# Patient Record
Sex: Male | Born: 1985 | Race: White | Hispanic: Yes | Marital: Married | Smoking: Never smoker
Health system: Southern US, Community
[De-identification: ages and names within clinical notes are randomized; demographics above are authoritative.]

## PROBLEM LIST (undated history)

## (undated) DIAGNOSIS — R51 Headache: Secondary | ICD-10-CM

## (undated) DIAGNOSIS — Z789 Other specified health status: Secondary | ICD-10-CM

## (undated) HISTORY — PX: HAND SURGERY: SHX662

## (undated) SURGERY — Surgical Case
Anesthesia: *Unknown

---

## 2012-07-23 ENCOUNTER — Other Ambulatory Visit: Payer: Self-pay | Admitting: Orthopedic Surgery

## 2012-07-24 ENCOUNTER — Inpatient Hospital Stay (HOSPITAL_COMMUNITY)
Admission: AD | Admit: 2012-07-24 | Discharge: 2012-07-31 | DRG: 513 | Disposition: A | Discharge status: Home Health | Payer: Worker's Compensation | Source: Ambulatory Visit | Attending: Orthopedic Surgery | Admitting: Orthopedic Surgery

## 2012-07-24 ENCOUNTER — Other Ambulatory Visit: Payer: Self-pay | Admitting: Orthopedic Surgery

## 2012-07-24 ENCOUNTER — Encounter (HOSPITAL_COMMUNITY): Payer: Self-pay | Admitting: *Deleted

## 2012-07-24 ENCOUNTER — Inpatient Hospital Stay (HOSPITAL_COMMUNITY): Payer: Worker's Compensation

## 2012-07-24 DIAGNOSIS — L02519 Cutaneous abscess of unspecified hand: Secondary | ICD-10-CM | POA: Diagnosis present

## 2012-07-24 DIAGNOSIS — M869 Osteomyelitis, unspecified: Principal | ICD-10-CM | POA: Diagnosis present

## 2012-07-24 DIAGNOSIS — Z6837 Body mass index (BMI) 37.0-37.9, adult: Secondary | ICD-10-CM

## 2012-07-24 HISTORY — DX: Other specified health status: Z78.9

## 2012-07-24 LAB — CBC WITH DIFFERENTIAL/PLATELET
Basophils Absolute: 0.1 10*3/uL (ref 0.0–0.1)
Eosinophils Absolute: 0.2 10*3/uL (ref 0.0–0.7)
Eosinophils Relative: 3 % (ref 0–5)
Lymphocytes Relative: 45 % (ref 12–46)
MCV: 82.1 fL (ref 78.0–100.0)
Platelets: 224 10*3/uL (ref 150–400)
RDW: 13.5 % (ref 11.5–15.5)
WBC: 6.7 10*3/uL (ref 4.0–10.5)

## 2012-07-24 LAB — SEDIMENTATION RATE: Sed Rate: 1 mm/hr (ref 0–16)

## 2012-07-24 LAB — PROTIME-INR: Prothrombin Time: 14.7 seconds (ref 11.6–15.2)

## 2012-07-24 LAB — BASIC METABOLIC PANEL
CO2: 26 mEq/L (ref 19–32)
Calcium: 9.3 mg/dL (ref 8.4–10.5)
Creatinine, Ser: 0.7 mg/dL (ref 0.50–1.35)
GFR calc non Af Amer: >90 mL/min (ref >90)

## 2012-07-24 MED ORDER — ONDANSETRON HCL 4 MG/2ML IJ SOLN: 4.0000 mg | Freq: Four times a day (QID) | INTRAMUSCULAR | Status: DC | PRN

## 2012-07-24 MED ORDER — VANCOMYCIN HCL IN DEXTROSE 1-5 GM/200ML-% IV SOLN: 1000.0000 mg | Freq: Two times a day (BID) | INTRAVENOUS | Status: DC

## 2012-07-24 MED ORDER — LACTATED RINGERS IV SOLN
INTRAVENOUS | Status: DC
  Administered 2012-07-27: 03:00:00 via INTRAVENOUS

## 2012-07-24 MED ORDER — MAGNESIUM HYDROXIDE 400 MG/5ML PO SUSP: 30.0000 mL | Freq: Every day | ORAL | Status: DC | PRN

## 2012-07-24 MED ORDER — HYDROCODONE-ACETAMINOPHEN 5-325 MG PO TABS
1.0000 | ORAL_TABLET | ORAL | Status: DC | PRN
  Administered 2012-07-26: 2 via ORAL
  Administered 2012-07-27 (×2): 1 via ORAL
  Administered 2012-07-27: 2 via ORAL
  Filled 2012-07-24 (×2): qty 1
  Filled 2012-07-24: qty 2
  Filled 2012-07-24: qty 1
  Filled 2012-07-24: qty 2

## 2012-07-24 MED ORDER — SODIUM CHLORIDE 0.9 % IV SOLN
1250.0000 mg | Freq: Two times a day (BID) | INTRAVENOUS | Status: DC
  Administered 2012-07-25 – 2012-07-27 (×5): 1250 mg via INTRAVENOUS
  Filled 2012-07-24 (×5): qty 1250

## 2012-07-24 MED ORDER — DIPHENHYDRAMINE HCL 25 MG PO CAPS
25.0000 mg | ORAL_CAPSULE | Freq: Four times a day (QID) | ORAL | Status: DC | PRN
  Administered 2012-07-24: 25 mg via ORAL
  Filled 2012-07-24: qty 1

## 2012-07-24 MED ORDER — ONDANSETRON HCL 4 MG PO TABS: 4.0000 mg | ORAL_TABLET | Freq: Four times a day (QID) | ORAL | Status: DC | PRN

## 2012-07-24 MED ORDER — OXYCODONE-ACETAMINOPHEN 5-325 MG PO TABS: 1.0000 | ORAL_TABLET | ORAL | Status: DC | PRN

## 2012-07-24 MED ORDER — PIPERACILLIN-TAZOBACTAM 3.375 G IVPB
3.3750 g | Freq: Three times a day (TID) | INTRAVENOUS | Status: DC
  Administered 2012-07-24: 3.375 g via INTRAVENOUS
  Filled 2012-07-24 (×3): qty 50

## 2012-07-24 MED ORDER — VANCOMYCIN HCL 10 G IV SOLR
2000.0000 mg | Freq: Once | INTRAVENOUS | Status: AC
  Administered 2012-07-24: 2000 mg via INTRAVENOUS
  Filled 2012-07-24: qty 2000

## 2012-07-24 MED ORDER — PIPERACILLIN-TAZOBACTAM 3.375 G IVPB
3.3750 g | Freq: Three times a day (TID) | INTRAVENOUS | Status: DC
  Administered 2012-07-25 – 2012-07-29 (×15): 3.375 g via INTRAVENOUS
  Filled 2012-07-24 (×16): qty 50

## 2012-07-24 MED ORDER — GADOBENATE DIMEGLUMINE 529 MG/ML IV SOLN
20.0000 mL | Freq: Once | INTRAVENOUS | Status: AC | PRN
  Administered 2012-07-24: 20 mL via INTRAVENOUS

## 2012-07-24 MED ORDER — ADULT MULTIVITAMIN W/MINERALS CH
1.0000 | ORAL_TABLET | Freq: Every day | ORAL | Status: DC
  Administered 2012-07-25 – 2012-07-31 (×6): 1 via ORAL
  Filled 2012-07-24 (×7): qty 1

## 2012-07-24 MED ORDER — DOCUSATE SODIUM 100 MG PO CAPS
100.0000 mg | ORAL_CAPSULE | Freq: Two times a day (BID) | ORAL | Status: DC
  Administered 2012-07-24 – 2012-07-31 (×11): 100 mg via ORAL
  Filled 2012-07-24 (×5): qty 1

## 2012-07-24 MED ORDER — HYDROMORPHONE HCL PF 1 MG/ML IJ SOLN
0.5000 mg | INTRAMUSCULAR | Status: DC | PRN
  Administered 2012-07-26: 1 mg via INTRAVENOUS
  Filled 2012-07-24: qty 1

## 2012-07-24 NOTE — Progress Notes (Signed)
ANTIBIOTIC CONSULT NOTE - INITIAL  Pharmacy Consult for Vancomycin & Zosyn Indication: L hand infection, likely osteomyelitis  Allergies not on file  Patient Measurements: Weight: 278 lb (126.1 kg)  Vital Signs: Temp: 98.2 F (36.8 C) (02/19 1523) Temp src: Oral (02/19 1523) BP: 144/85 mmHg (02/19 1523) Pulse Rate: 83 (02/19 1523) Intake/Output from previous day:   Intake/Output from this shift:    Labs: No results found for this basename: WBC, HGB, PLT, LABCREA, CREATININE,  in the last 72 hours CrCl is unknown because no creatinine reading has been taken and the patient has no height on file. No results found for this basename: VANCOTROUGH, VANCOPEAK, VANCORANDOM, GENTTROUGH, GENTPEAK, GENTRANDOM, TOBRATROUGH, TOBRAPEAK, TOBRARND, AMIKACINPEAK, AMIKACINTROU, AMIKACIN,  in the last 72 hours   Microbiology: No results found for this or any previous visit (from the past 720 hour(s)).  Medical History: No past medical history on file.  Medications:  Anti-infectives   Start     Dose/Rate Route Frequency Ordered Stop   07/25/12 0400  vancomycin (VANCOCIN) 1,250 mg in sodium chloride 0.9 % 250 mL IVPB     1,250 mg 166.7 mL/hr over 90 Minutes Intravenous Every 12 hours 07/24/12 1606     07/24/12 1630  vancomycin (VANCOCIN) 2,000 mg in sodium chloride 0.9 % 500 mL IVPB     2,000 mg 250 mL/hr over 120 Minutes Intravenous  Once 07/24/12 1606     07/24/12 1600  piperacillin-tazobactam (ZOSYN) IVPB 3.375 g     3.375 g 12.5 mL/hr over 240 Minutes Intravenous 3 times per day 07/24/12 1554     07/24/12 1554  vancomycin (VANCOCIN) IVPB 1000 mg/200 mL premix  Status:  Discontinued     1,000 mg 200 mL/hr over 60 Minutes Intravenous Every 12 hours 07/24/12 1554 07/24/12 1602     Assessment: 26 yoM with crush injury to left hand 05/09/12, surgical repair included pin fixation, with hardware removal 06/18/12. Now with fever,chills; begin IV antibiotics-Vancomycin and Zosyn; pharmacy to  dose.  Possible osteo noted  Goal of Therapy:  Vancomycin trough level 15-20 mcg/ml Aiming for higher trough level with possibility of osteomyelitis and planned antibiotic spacer  Plan:  Vancomycin 2000mg  x1, then 1250mg  q12 Zosyn 3.375gm q8h- 4 hr infusion. Vanc trough at steady state if necessary.  Otho Bellows PharmD Pager [phone] 07/24/2012,4:04 PM

## 2012-07-24 NOTE — H&P (Signed)
Primary Care Provider: None Referring Provider: None Worker's Comp: Yes Date of Injury or Onset: 05-09-12  History: CC / Reason for Visit: Left hand problem HPI: This patient is a 27 year old Latino male who presents for evaluation of a left hand problem that began on the date above.  Reportedly the left hand was crushed in a large industrial-type drill.  This occurred while he was working in [place].  He was initially cared for by Dr. Norman Herrlich, of [facility] and underwent open reduction, percutaneous pin fixation of left fourth and fifth metacarpal fractures on 05-14-12.  X-rays from 05-24-12 have become available for my review within the last couple of days indicating transverse fractures through the mid diaphysis of the fifth metacarpal and the proximal diaphysis of the fourth metacarpal with near-anatomic alignment and single pin fixation of each in place.  Subsequent x-rays from 06-12-12 are also available, indicating some early osseous healing of the fifth metacarpal and also some of the fourth.  Records indicate in the patient confirms that on 06-18-12, he returned to the operating room for removal of hardware under MAC anesthesia as both hands have been slightly buried with tissue about them.  The distal pin is noted to be somewhat buried beneath the skin in the operative report.  It is my understanding that the patient has not had any subsequent followup.  He presented initially for an incomplete visit to this office on 07-12-12, but without any of his previous records or x-rays available.  Since those records have become available, he has been rescheduled to his first official new patient appointment tomorrow, but presents today instead noting the onset of some chills and feeling warm since last night.  He continues to have difficulty regaining range of motion in the left hand and it remains swollen, particularly dorsally.  Past medical history, past surgical history, family  history, social history, medications, allergies and review of systems are thoroughly reviewed by me, signed and scanned into SRS today.  The patient is otherwise healthy.  Exam:  Vitals: Refer to EMR. Constitutional:  WD, WN, NAD HEENT:  NCAT, EOMI Neuro/Psych:  Alert & oriented to person, place, and time; appropriate mood & affect Lymphatic: No generalized UE edema or lymphadenopathy Extremities / MSK:  Both UE are normal with respect to appearance, ranges of motion, joint stability, muscle strength/tone, sensation, & perfusion except as otherwise noted:  Left hand has no active drainage.  A well-healed incision is present over the interspace between the fourth and fifth metacarpals.  Digital motion is less than full with the fingertips 3 cm from the palm.  There is tenderness and firm swelling without fluctuance on the dorsum of the hand particularly over the third fourth and fifth metacarpal region.  No specific redness noted.  Labs / Xrays:  No radiographic studies obtained today.  X-rays from his previous care up compared to those from 07-13-11.  There has been progressive changes of Lytic and sclerotic regions consistent with cortical erosion in the midportion of the fourth metacarpal.   Fifth metacarpal appears more healed.    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  Plan:  I discussed these findings in detail with the patient and outlined a plan for treatment and reconstruction.  He will be admitted today to [facility] and initiation of IV antibiotic therapy and we will plan to proceed with surgical debridement of all infected tissues on Friday.  He will likely  need placement of antibiotic PMMA spacer to allow for possible subsequent fourth metacarpal reconstruction once the infection has been eradicated.  I will obtain an MRI scan of the left hand and subsequently consult infectious diseases regarding antibiotic  management, but will begin with vancomycin and Zosyn.  Work status: All interested parties should please consider this patient to be out of work entirely if no work is available that complies with the restrictions detailed below.  If these restrictions result in the patient being out of work entirely, the employer is expected to provide documentation of such to all interested third parties such as disability insurance companies: No work with left hand

## 2012-07-25 MED ORDER — SODIUM CHLORIDE 0.9 % IJ SOLN
10.0000 mL | INTRAMUSCULAR | Status: DC | PRN
  Administered 2012-07-27 – 2012-07-29 (×2): 10 mL

## 2012-07-25 NOTE — Progress Notes (Signed)
UR completed 

## 2012-07-25 NOTE — Progress Notes (Signed)
Subjective: HD 2, Left hand osteomyelitis Pain adequately controlled, consent executed   Objective: Vital signs in last 24 hours: Temp:  [97.6 F (36.4 C)-97.8 F (36.6 C)] 97.6 F (36.4 C) (02/20 1318) Pulse Rate:  [59-65] 64 (02/20 1318) Resp:  [18-20] 18 (02/20 1318) BP: (109-144)/(68-84) 142/76 mmHg (02/20 1318) SpO2:  [94 %-98 %] 94 % (02/20 1318)  Intake/Output from previous day: 02/19 0701 - 02/20 0700 In: 600 [IV Piggyback:600] Out: -  Intake/Output this shift:     Recent Labs  07/24/12 1638  HGB 15.1    Recent Labs  07/24/12 1638  WBC 6.7  RBC 5.43  HCT 44.6  PLT 224    Recent Labs  07/24/12 1638  NA 138  K 4.0  CL 100  CO2 26  BUN 12  CREATININE 0.70  GLUCOSE 89  CALCIUM 9.3    Recent Labs  07/24/12 1638  INR 1.17    Left hand unchanged--swollen with poor active flexion of RF/SF   Assessment/Plan: OR tomorrow, approximately 12:30, for debridement.  Will obtain cultures. Continue current antibiotic treatment for now.   Bienvenido Proehl A. 07/25/2012, 5:38 PM

## 2012-07-25 NOTE — Progress Notes (Signed)
Nutrition Brief Note  Patient identified on the Malnutrition Screening Tool (MST) Report  Body mass index is 37.7 kg/(m^2). Patient meets criteria for class II obesity based on current BMI.   Current diet order is regular, patient is consuming approximately 100% of meals at this time. Labs and medications reviewed. Pt reports great appetite PTA and eating well, 3 meals/day. Pt reports 15 pound unintended weight loss in the past month which pt attributes to stress from accident on his left hand. Pt reports he was wondering what would happen with his job and how he would support his family in [place] Recommend social work consult to help with these issues.   No nutrition interventions warranted at this time. If nutrition issues arise, please consult RD.   Levon Hedger MS, RD, LDN [phone] Pager [phone] After Hours Pager

## 2012-07-25 NOTE — Progress Notes (Signed)
Peripherally Inserted Central Catheter/Midline Placement  The IV Nurse has discussed with the patient and/or persons authorized to consent for the patient, the purpose of this procedure and the potential benefits and risks involved with this procedure.  The benefits include less needle sticks, lab draws from the catheter and patient may be discharged home with the catheter.  Risks include, but not limited to, infection, bleeding, blood clot (thrombus formation), and puncture of an artery; nerve damage and irregular heat beat.  Alternatives to this procedure were also discussed.  PICC/Midline Placement Documentation        Keith Nash 07/25/2012, 11:14 AM

## 2012-07-26 ENCOUNTER — Inpatient Hospital Stay (HOSPITAL_COMMUNITY): Payer: Worker's Compensation

## 2012-07-26 ENCOUNTER — Encounter (HOSPITAL_COMMUNITY): Payer: Self-pay | Admitting: Certified Registered Nurse Anesthetist

## 2012-07-26 ENCOUNTER — Encounter (HOSPITAL_COMMUNITY): Payer: Self-pay | Admitting: Anesthesiology

## 2012-07-26 ENCOUNTER — Inpatient Hospital Stay (HOSPITAL_COMMUNITY): Payer: Worker's Compensation | Admitting: Anesthesiology

## 2012-07-26 ENCOUNTER — Encounter (HOSPITAL_COMMUNITY)
Admission: AD | Disposition: A | Discharge status: Home Health | Payer: Self-pay | Source: Ambulatory Visit | Attending: Orthopedic Surgery

## 2012-07-26 ENCOUNTER — Inpatient Hospital Stay: Admit: 2012-07-26 | Payer: Self-pay | Admitting: Orthopedic Surgery

## 2012-07-26 HISTORY — PX: AMPUTATION: SHX166

## 2012-07-26 SURGERY — OPEN REDUCTION INTERNAL FIXATION (ORIF) METACARPAL
Anesthesia: General | Laterality: Left

## 2012-07-26 SURGERY — AMPUTATION DIGIT
Anesthesia: General | Site: Hand | Laterality: Left | Wound class: Dirty or Infected

## 2012-07-26 MED ORDER — LIDOCAINE HCL (CARDIAC) 20 MG/ML IV SOLN
INTRAVENOUS | Status: DC | PRN
  Administered 2012-07-26: 100 mg via INTRAVENOUS

## 2012-07-26 MED ORDER — PROPOFOL 10 MG/ML IV BOLUS
INTRAVENOUS | Status: DC | PRN
  Administered 2012-07-26: 200 mg via INTRAVENOUS

## 2012-07-26 MED ORDER — MIDAZOLAM HCL 5 MG/5ML IJ SOLN
INTRAMUSCULAR | Status: DC | PRN
  Administered 2012-07-26: 2 mg via INTRAVENOUS

## 2012-07-26 MED ORDER — BUPIVACAINE-EPINEPHRINE 0.5% -1:200000 IJ SOLN
INTRAMUSCULAR | Status: DC | PRN
  Administered 2012-07-26: 10 mL

## 2012-07-26 MED ORDER — 0.9 % SODIUM CHLORIDE (POUR BTL) OPTIME
TOPICAL | Status: DC | PRN
  Administered 2012-07-26: 1000 mL

## 2012-07-26 MED ORDER — KETOROLAC TROMETHAMINE 30 MG/ML IJ SOLN: 15.0000 mg | Freq: Once | INTRAMUSCULAR | Status: DC | PRN

## 2012-07-26 MED ORDER — NAPROXEN 500 MG PO TABS
500.0000 mg | ORAL_TABLET | Freq: Two times a day (BID) | ORAL | Status: DC
  Administered 2012-07-26 – 2012-07-31 (×10): 500 mg via ORAL
  Filled 2012-07-26 (×12): qty 1

## 2012-07-26 MED ORDER — LACTATED RINGERS IV SOLN
INTRAVENOUS | Status: DC | PRN
  Administered 2012-07-26 (×2): via INTRAVENOUS

## 2012-07-26 MED ORDER — METOCLOPRAMIDE HCL 5 MG/ML IJ SOLN
INTRAMUSCULAR | Status: DC | PRN
  Administered 2012-07-26: 5 mg via INTRAVENOUS

## 2012-07-26 MED ORDER — FENTANYL CITRATE 0.05 MG/ML IJ SOLN
INTRAMUSCULAR | Status: DC | PRN
  Administered 2012-07-26: 50 ug via INTRAVENOUS
  Administered 2012-07-26: 100 ug via INTRAVENOUS
  Administered 2012-07-26 (×3): 50 ug via INTRAVENOUS
  Administered 2012-07-26: 100 ug via INTRAVENOUS
  Administered 2012-07-26 (×2): 50 ug via INTRAVENOUS

## 2012-07-26 MED ORDER — ONDANSETRON HCL 4 MG/2ML IJ SOLN
INTRAMUSCULAR | Status: DC | PRN
  Administered 2012-07-26 (×2): 2 mg via INTRAVENOUS

## 2012-07-26 MED ORDER — ACETAMINOPHEN 10 MG/ML IV SOLN
INTRAVENOUS | Status: DC | PRN
  Administered 2012-07-26: 1000 mg via INTRAVENOUS

## 2012-07-26 MED ORDER — FENTANYL CITRATE 0.05 MG/ML IJ SOLN: 25.0000 ug | INTRAMUSCULAR | Status: DC | PRN

## 2012-07-26 MED ORDER — PROMETHAZINE HCL 25 MG/ML IJ SOLN: 6.2500 mg | INTRAMUSCULAR | Status: DC | PRN

## 2012-07-26 SURGICAL SUPPLY — 53 items
0.62 KWIRE STRYKER ×2 IMPLANT
BANDAGE COBAN STERILE 2 (GAUZE/BANDAGES/DRESSINGS) IMPLANT
BANDAGE CONFORM 2  STR LF (GAUZE/BANDAGES/DRESSINGS) IMPLANT
BANDAGE CONFORM 3  STR LF (GAUZE/BANDAGES/DRESSINGS) IMPLANT
BANDAGE GAUZE ELAST BULKY 4 IN (GAUZE/BANDAGES/DRESSINGS) IMPLANT
BLADE AVERAGE 25X9 (BLADE) IMPLANT
BLADE MINI RND TIP GREEN BEAV (BLADE) IMPLANT
BLADE OSCILLATING/SAGITTAL (BLADE) ×1
BLADE SURG 15 STRL LF DISP TIS (BLADE) ×1 IMPLANT
BLADE SURG 15 STRL SS (BLADE) ×1
BLADE SW THK.38XMED LNG THN (BLADE) ×1 IMPLANT
BNDG COHESIVE 4X5 TAN NS LF (GAUZE/BANDAGES/DRESSINGS) IMPLANT
BNDG COHESIVE 4X5 TAN STRL (GAUZE/BANDAGES/DRESSINGS) ×2 IMPLANT
BNDG ESMARK 4X9 LF (GAUZE/BANDAGES/DRESSINGS) IMPLANT
BONE CEMENT GENTAMICIN (Cement) ×2 IMPLANT
CEMENT BONE GENTAMICIN 40 (Cement) ×1 IMPLANT
CHLORAPREP W/TINT 26ML (MISCELLANEOUS) ×2 IMPLANT
CORDS BIPOLAR (ELECTRODE) ×2 IMPLANT
DRAPE C-ARM 42X72 X-RAY (DRAPES) IMPLANT
DRAPE LG THREE QUARTER DISP (DRAPES) IMPLANT
DRAPE SURG 17X11 SM STRL (DRAPES) ×2 IMPLANT
DRSG EMULSION OIL 3X3 NADH (GAUZE/BANDAGES/DRESSINGS) ×2 IMPLANT
ELECT REM PT RETURN 9FT ADLT (ELECTROSURGICAL) ×2
ELECTRODE REM PT RTRN 9FT ADLT (ELECTROSURGICAL) ×1 IMPLANT
GAUZE XEROFORM 1X8 LF (GAUZE/BANDAGES/DRESSINGS) IMPLANT
GAUZE XEROFORM 5X9 LF (GAUZE/BANDAGES/DRESSINGS) IMPLANT
GLOVE BIO SURGEON STRL SZ7.5 (GLOVE) ×2 IMPLANT
GLOVE BIOGEL PI IND STRL 8 (GLOVE) ×1 IMPLANT
GLOVE BIOGEL PI INDICATOR 8 (GLOVE) ×1
GOWN PREVENTION PLUS XLARGE (GOWN DISPOSABLE) ×2 IMPLANT
GUIDEWIRE ORTH 6X062XTROC NS (WIRE) ×1 IMPLANT
K-WIRE .062 (WIRE) ×1
KWIRE 4.0 X .045IN (WIRE) IMPLANT
NEEDLE HYPO 25X1 1.5 SAFETY (NEEDLE) IMPLANT
NS IRRIG 500ML POUR BTL (IV SOLUTION) ×2 IMPLANT
PACK LOWER EXTREMITY WL (CUSTOM PROCEDURE TRAY) ×2 IMPLANT
PAD CAST 4YDX4 CTTN HI CHSV (CAST SUPPLIES) ×1 IMPLANT
PADDING CAST ABS 4INX4YD NS (CAST SUPPLIES) ×1
PADDING CAST ABS COTTON 4X4 ST (CAST SUPPLIES) ×1 IMPLANT
PADDING CAST COTTON 4X4 STRL (CAST SUPPLIES) ×1
PENCIL BUTTON HOLSTER BLD 10FT (ELECTRODE) IMPLANT
SPONGE GAUZE 4X4 12PLY (GAUZE/BANDAGES/DRESSINGS) ×2 IMPLANT
STOCKINETTE 4X48 STRL (DRAPES) IMPLANT
SUCTION FRAZIER TIP 10 FR DISP (SUCTIONS) IMPLANT
SUT ETHILON 4 0 PS 2 18 (SUTURE) ×2 IMPLANT
SUT MNCRL AB 4-0 PS2 18 (SUTURE) IMPLANT
SUT VIC AB 4-0 P-3 18XBRD (SUTURE) IMPLANT
SUT VIC AB 4-0 P3 18 (SUTURE)
SUT VICRYL RAPIDE 4/0 PS 2 (SUTURE) IMPLANT
SYR BULB 3OZ (MISCELLANEOUS) ×2 IMPLANT
SYRINGE 10CC LL (SYRINGE) IMPLANT
TOWEL OR 17X24 6PK STRL BLUE (TOWEL DISPOSABLE) ×2 IMPLANT
UNDERPAD 30X30 INCONTINENT (UNDERPADS AND DIAPERS) ×2 IMPLANT

## 2012-07-26 NOTE — Progress Notes (Signed)
Patient seen. Surgery today as outlined previously.

## 2012-07-26 NOTE — Anesthesia Preprocedure Evaluation (Addendum)
Anesthesia Evaluation  Patient identified by MRN, date of birth, ID band Patient awake    Reviewed: Allergy & Precautions, H&P , NPO status , Patient's Chart, lab work & pertinent test results  Airway Mallampati: II  TM Distance: <3 FB Neck ROM: Full    Dental no notable dental hx.    Pulmonary neg pulmonary ROS,  breath sounds clear to auscultation  Pulmonary exam normal       Cardiovascular negative cardio ROS  Rhythm:Regular Rate:Normal     Neuro/Psych negative neurological ROS  negative psych ROS   GI/Hepatic negative GI ROS, Neg liver ROS,   Endo/Other  Morbid obesity  Renal/GU negative Renal ROS  negative genitourinary   Musculoskeletal negative musculoskeletal ROS (+)   Abdominal   Peds negative pediatric ROS (+)  Hematology negative hematology ROS (+)   Anesthesia Other Findings   Reproductive/Obstetrics negative OB ROS                             Anesthesia Physical Anesthesia Plan  ASA: II  Anesthesia Plan: General   Post-op Pain Management:    Induction: Intravenous  Airway Management Planned: LMA  Additional Equipment:   Intra-op Plan:   Post-operative Plan:   Informed Consent: I have reviewed the patients History and Physical, chart, labs and discussed the procedure including the risks, benefits and alternatives for the proposed anesthesia with the patient or authorized representative who has indicated his/her understanding and acceptance.   Dental advisory given  Plan Discussed with: CRNA and Surgeon  Anesthesia Plan Comments:         Anesthesia Quick Evaluation  

## 2012-07-26 NOTE — Progress Notes (Signed)
ANTIBIOTIC CONSULT NOTE - FOLLOW UP  Pharmacy Consult for Vancomycin, Zosyn Indication: hand osteomyelitis  No Known Allergies  Patient Measurements: Height: 6' (182.9 cm) (per pt report) Weight: 278 lb (126.1 kg) IBW/kg (Calculated) : 77.6  Vital Signs: Temp: 97.7 F (36.5 C) (02/21 0654) Temp src: Oral (02/21 0654) BP: 142/88 mmHg (02/21 0654) Pulse Rate: 62 (02/21 0654) Intake/Output from previous day: 02/20 0701 - 02/21 0700 In: 2900 [P.O.:1200; IV Piggyback:1700] Out: -   Labs:  Recent Labs  07/24/12 1638  WBC 6.7  HGB 15.1  PLT 224  CREATININE 0.70   Estimated Creatinine Clearance: 192 ml/min (by C-G formula based on Cr of 0.7). No results found for this basename: VANCOTROUGH, VANCOPEAK, VANCORANDOM, GENTTROUGH, GENTPEAK, GENTRANDOM, TOBRATROUGH, TOBRAPEAK, TOBRARND, AMIKACINPEAK, AMIKACINTROU, AMIKACIN,  in the last 72 hours   Microbiology: No results found for this or any previous visit (from the past 720 hour(s)).  Anti-infectives: 2/19 >> Vanc >> 2/19 >> Zosyn >>  Assessment:  21 yoM with crush injury to left hand 05/09/12, surgical repair included pin fixation, with hardware removal 06/18/12.   Presents to [facility] ED 2/19 with fever, chills, and concern for osteomyelitis.  Pharrmacy asked to assist with antibiotic dosing.  D#3 Vancomycin and Zosyn.  MRI on 2/19 shows osteomyelitis - planning for OR surgical debridement, abx spacer.  No cx obtained yet (will likely get cxt in OR).   No new labs since 2/19 - will get SCr and Vanc trough tonight.  Goal of Therapy:  Vancomycin trough level 15-20 mcg/ml Appropriate abx dosing, eradication of infection.  Plan:   Continue Zosyn 3.375g IV Q8H infused over 4hrs.  Continue Vancomycin 1250mg  IV q12h.  Measure Vanc trough at steady state.  Follow up renal fxn and culture results.   Lynann Beaver PharmD, BCPS Pager [phone] 07/26/2012 11:41 AM

## 2012-07-26 NOTE — Preoperative (Addendum)
Beta Blockers   Reason not to administer Beta Blockers:Not Applicable 

## 2012-07-26 NOTE — Op Note (Signed)
07/24/2012 - 07/26/2012  2:40 PM  PATIENT:  Keith Nash  27 y.o. male  PRE-OPERATIVE DIAGNOSIS:  Suspected Left hand osteomyelitis & surrounding phlegmon  POST-OPERATIVE DIAGNOSIS:  Same  PROCEDURE:  Left hand debridement of phlegmon including 3.75 cm of 4th MC and surrounding soft-tissue with implantation of gentamicin cement spacer and central k-wire                            Manipulation of SF MPJ & PIPJ; Manipulation of RF MPJ  SURGEON: Cliffton Asters. Janee Morn, MD  PHYSICIAN ASSISTANT: None  ANESTHESIA:  general  SPECIMENS:  None  DRAINS:   None  PREOPERATIVE INDICATIONS:  Keith Nash is a  27 y.o. male with a diagnosis of suspected left hand osteomyelitis with surrounding phlegmon  The risks benefits and alternatives were discussed with the patient preoperatively including but not limited to the risks of infection, bleeding, nerve injury, cardiopulmonary complications, the need for revision surgery, among others, and the patient verbalized understanding and consented to proceed.  OPERATIVE IMPLANTS: 0.062 kwire within the gent cement spacer  OPERATIVE FINDINGS:  Soft 4 MC shaft suspicious for osteomyelitis, with surrounding phlegmon  OPERATIVE PROCEDURE:  The patient was escorted to the operative theatre and placed in a supine position.  GA was administered.  A surgical "time-out" was performed during which the planned procedure, proposed operative site, and the correct patient identity were compared to the operative consent and agreement confirmed by the circulating nurse according to current facility policy.  Following application of a tourniquet to the operative extremity, the exposed skin was prepped with Chloraprep and draped in the usual sterile fashion.  The limb was exsanguinated with an Esmarch bandage and the tourniquet inflated to approximately higher than systolic BP.  The previous incision was marked then elliptically excised. It was  extended proximally and distally a slightly curvilinear fashion creating a radial-based flap. The skin and subcutaneous tissues were dissected with sharp and spreading dissection. Extensor tendons were identified and retracted radially. The ring and small finger MP joints were gently but firmly manipulated into full flexion and held in a position for 30 seconds. The PIP joint of the small finger was manipulated into full extension and tissue using disruption was appreciated. The tissue over the metacarpal was thickened and firm, there was no fluid component. Longitudinal dissection was carried sharply down overload midshaft of the metacarpal. Metacarpals found to be quite soft. He was easily excised piecemeal with a rongeur. Ultimately a saw was used to sharply divide the fourth metacarpal transversely in the region where grossly it looked to be acceptable. 3.75 cm of the metacarpal shaft was excised in this manner, some were sent to pathology. The surrounding phlegmon was debrided with the rongeur and sharp dissection.  Tissue was sent for culture.  Once satisfied with the degree debridement, a gentamicin impregnated cement spacer was constructed with a central longitudinal K wire. The medullary cavity of the distal fragment was debrided with an osteotome and then the K wire which was left protruding from spacer was placed within the central canal thus helping to secure it to the distal bone fragment. It had to be adjusted in length twice to achieve an appropriate length. This was done by cutting spacer with a soft. The proximal and the K wire was within the spacer but did not penetrate the bone. The area resection was copiously irrigated and tourniquet released. Some additional hemostasis obtained in  this patient was placed. 1/4 inch penrose drain was left emanating from the proximal & distal aspects of the incision and the skin was closed with 4-0 nylon interrupted sutures.  A bulky hand dressing was applied  with dorsal splint component placing the wrist in slight extension, the MP joints flexed towards 90 and IP joints extended. The file tourniquet release was performed the patient was awakened and taken to recovery in stable condition.  DISPOSITION: Patient transferred back to the floor for further care. Antibiotics will be managed according to the culture results, likely with infectious diseases consultation.

## 2012-07-26 NOTE — Transfer of Care (Signed)
Immediate Anesthesia Transfer of Care Note  Patient: Jazmin Fuentes-Valentin  Procedure(s) Performed: Procedure(s) with comments: PARTIAL EXCISION 4TH METACARPAL/RADICAL EXCISION OF EXTENSOR BURSA/TENOSYNOVIUM (Left) - PLACEMENT OF ANTIBIOTIC CEMENT SPACER   Patient Location: PACU  Anesthesia Type:General  Level of Consciousness: awake, oriented, patient cooperative, lethargic and responds to stimulation  Airway & Oxygen Therapy: Patient Spontanous Breathing and Patient connected to face mask oxygen  Post-op Assessment: Report given to PACU RN, Post -op Vital signs reviewed and stable and Patient moving all extremities  Post vital signs: Reviewed and stable  Complications: No apparent anesthesia complications

## 2012-07-27 LAB — CREATININE, SERUM: Creatinine, Ser: 0.78 mg/dL (ref 0.50–1.35)

## 2012-07-27 MED ORDER — VANCOMYCIN HCL 10 G IV SOLR
1250.0000 mg | Freq: Three times a day (TID) | INTRAVENOUS | Status: DC
  Administered 2012-07-27 – 2012-07-28 (×4): 1250 mg via INTRAVENOUS
  Filled 2012-07-27 (×5): qty 1250

## 2012-07-27 NOTE — Progress Notes (Signed)
ANTIBIOTIC CONSULT NOTE - FOLLOW UP  Pharmacy Consult for vancomycin Indication: L hand infection, likely osteomyelitis   No Known Allergies  Patient Measurements: Height: 6' (182.9 cm) Weight: 278 lb (126.1 kg) IBW/kg (Calculated) : 77.6 Adjusted Body Weight:   Vital Signs: Temp: 98.7 F (37.1 C) (02/22 0537) Temp src: Oral (02/22 0537) BP: 143/82 mmHg (02/22 0537) Pulse Rate: 67 (02/22 0537) Intake/Output from previous day: 02/21 0701 - 02/22 0700 In: 2590 [P.O.:580; I.V.:1210; IV Piggyback:800] Out: -  Intake/Output from this shift: Total I/O In: 820 [P.O.:220; I.V.:300; IV Piggyback:300] Out: -   Labs:  Recent Labs  07/24/12 1638 07/27/12 0500  WBC 6.7  --   HGB 15.1  --   PLT 224  --   CREATININE 0.70 0.78   Estimated Creatinine Clearance: 192 ml/min (by C-G formula based on Cr of 0.78).  Recent Labs  07/27/12 0500  VANCOTROUGH 5.1*     Microbiology: No results found for this or any previous visit (from the past 720 hour(s)).  Anti-infectives   Start     Dose/Rate Route Frequency Ordered Stop   07/27/12 1200  vancomycin (VANCOCIN) 1,250 mg in sodium chloride 0.9 % 250 mL IVPB     1,250 mg 166.7 mL/hr over 90 Minutes Intravenous Every 8 hours 07/27/12 0607     07/25/12 0600  vancomycin (VANCOCIN) 1,250 mg in sodium chloride 0.9 % 250 mL IVPB  Status:  Discontinued     1,250 mg 166.7 mL/hr over 90 Minutes Intravenous Every 12 hours 07/24/12 1606 07/27/12 0608   07/25/12 0200  piperacillin-tazobactam (ZOSYN) IVPB 3.375 g     3.375 g 12.5 mL/hr over 240 Minutes Intravenous Every 8 hours 07/24/12 2102     07/24/12 1630  vancomycin (VANCOCIN) 2,000 mg in sodium chloride 0.9 % 500 mL IVPB     2,000 mg 250 mL/hr over 120 Minutes Intravenous  Once 07/24/12 1606 07/24/12 2056   07/24/12 1600  piperacillin-tazobactam (ZOSYN) IVPB 3.375 g  Status:  Discontinued     3.375 g 12.5 mL/hr over 240 Minutes Intravenous 3 times per day 07/24/12 1554 07/24/12 2102    07/24/12 1554  vancomycin (VANCOCIN) IVPB 1000 mg/200 mL premix  Status:  Discontinued     1,000 mg 200 mL/hr over 60 Minutes Intravenous Every 12 hours 07/24/12 1554 07/24/12 1602      Assessment: Patient with low vancomycin level.  Prior doses were charted appropriately.  2gm load was charted, feel due to young age, body mass and good renal function that this expected dose is not enough for this patient.    Goal of Therapy:  Vancomycin trough level 15-20 mcg/ml  Plan:  Measure antibiotic drug levels at steady state Follow up culture results Change to vancomycin 1250mg  iv q8hr, next dose at 1200 (~6hr prior to last dose)  Darlina Guys, Napoleon Crowford 07/27/2012,6:08 AM

## 2012-07-27 NOTE — Progress Notes (Addendum)
Subjective: POD 1 L hand debridement (3.75 cm of excised, antibiotic spacer placed) Pain control OK, wiggling fingers a little   Objective: Vital signs in last 24 hours: Temp:  [97.6 F (36.4 C)-98.7 F (37.1 C)] 98.7 F (37.1 C) (02/22 0537) Pulse Rate:  [57-77] 67 (02/22 0537) Resp:  [13-18] 16 (02/22 0537) BP: (129-189)/(74-106) 143/82 mmHg (02/22 0537) SpO2:  [97 %-100 %] 99 % (02/22 0537) Weight:  [126.1 kg (278 lb)] 126.1 kg (278 lb) (02/21 1530)  Intake/Output from previous day: 02/21 0701 - 02/22 0700 In: 2590 [P.O.:580; I.V.:1210; IV Piggyback:800] Out: -  Intake/Output this shift:     Recent Labs  07/24/12 1638  HGB 15.1    Recent Labs  07/24/12 1638  WBC 6.7  RBC 5.43  HCT 44.6  PLT 224    Recent Labs  07/24/12 1638 07/27/12 0500  NA 138  --   K 4.0  --   CL 100  --   CO2 26  --   BUN 12  --   CREATININE 0.70 0.78  GLUCOSE 89  --   CALCIUM 9.3  --     Recent Labs  07/24/12 1638  INR 1.17   Dressing intact, digits in good alignment with MPs flexed and IPs extended NVI  Assessment/Plan: Cxs & path pending Continue zosyn/vanc via PICC until C&S available--will obtain ID c/s then, likely Monday Will likely d/c home with protracted home IV antibiotics Will d/c dressing likely on Sunday Appreciate pharmacy help with IV antibiotic dosing  Keith Nash A. 07/27/2012, 9:13 AM

## 2012-07-28 LAB — VANCOMYCIN, TROUGH: Vancomycin Tr: 6.8 ug/mL — ABNORMAL LOW (ref 10.0–20.0)

## 2012-07-28 MED ORDER — VANCOMYCIN HCL 500 MG IV SOLR
500.0000 mg | Freq: Once | INTRAVENOUS | Status: AC
  Administered 2012-07-28: 500 mg via INTRAVENOUS
  Filled 2012-07-28: qty 500

## 2012-07-28 MED ORDER — NICOTINE 14 MG/24HR TD PT24
14.0000 mg | MEDICATED_PATCH | Freq: Every day | TRANSDERMAL | Status: DC
  Administered 2012-07-28 – 2012-07-31 (×4): 14 mg via TRANSDERMAL
  Filled 2012-07-28 (×4): qty 1

## 2012-07-28 MED ORDER — OXYCODONE-ACETAMINOPHEN 5-325 MG PO TABS: 1.0000 | ORAL_TABLET | ORAL | Status: DC | PRN

## 2012-07-28 MED ORDER — NAPROXEN 500 MG PO TABS: 500.0000 mg | ORAL_TABLET | Freq: Two times a day (BID) | ORAL | Status: DC

## 2012-07-28 MED ORDER — VANCOMYCIN HCL 10 G IV SOLR
1750.0000 mg | Freq: Three times a day (TID) | INTRAVENOUS | Status: DC
  Administered 2012-07-28 – 2012-07-29 (×4): 1750 mg via INTRAVENOUS
  Filled 2012-07-28 (×5): qty 1750

## 2012-07-28 NOTE — Anesthesia Postprocedure Evaluation (Signed)
Anesthesia Post Note  Patient: Keith Nash  Procedure(s) Performed: Procedure(s) (LRB): PARTIAL EXCISION 4TH METACARPAL/RADICAL EXCISION OF EXTENSOR BURSA/TENOSYNOVIUM (Left)  Anesthesia type: General  Patient location: PACU  Post pain: Pain level controlled  Post assessment: Post-op Vital signs reviewed  Last Vitals:  Filed Vitals:   07/28/12 0530  BP: 133/83  Pulse: 83  Temp: 36.8 C  Resp: 16    Post vital signs: Reviewed  Level of consciousness: sedated  Complications: No apparent anesthesia complications

## 2012-07-28 NOTE — Progress Notes (Signed)
Subjective: POD 2 L hand debridement (3.75 cm of excised, antibiotic spacer placed) Pain control OK, wiggling fingers a little  Tol PO, voiding OK  Objective: Vital signs in last 24 hours: Temp:  [97.6 F (36.4 C)-98.3 F (36.8 C)] 98.3 F (36.8 C) (02/23 0530) Pulse Rate:  [60-85] 83 (02/23 0530) Resp:  [16-17] 16 (02/23 0530) BP: (132-145)/(83-89) 133/83 mmHg (02/23 0530) SpO2:  [97 %-99 %] 97 % (02/23 0530)  Intake/Output from previous day: 02/22 0701 - 02/23 0700 In: 3023.3 [P.O.:1580; I.V.:543.3; IV Piggyback:900] Out: -  Intake/Output this shift:    No results found for this basename: HGB,  in the last 72 hours No results found for this basename: WBC, RBC, HCT, PLT,  in the last 72 hours  Recent Labs  07/27/12 0500  CREATININE 0.78   No results found for this basename: LABPT, INR,  in the last 72 hours Dressing removed, penrose drain pulled. Incision clean/dry with no active drainage. digits in good alignment with MPs flexed and IPs extended NVI  Assessment/Plan: Cxs & path pending Continue zosyn/vanc via PICC until C&S available--will obtain ID c/s then, likely Monday Will likely d/c home with protracted home IV antibiotics Begin light daily dressing changes and OT for ROM work Appreciate pharmacy help with IV antibiotic dosing  Ayush Boulet A. 07/28/2012, 8:48 AM

## 2012-07-28 NOTE — Progress Notes (Signed)
ANTIBIOTIC CONSULT NOTE - FOLLOW UP  Pharmacy Consult for Vancomycin, Zosyn Indication: hand osteomyelitis  No Known Allergies  Patient Measurements: Height: 6' (182.9 cm) Weight: 278 lb (126.1 kg) IBW/kg (Calculated) : 77.6  Vital Signs: Temp: 98.3 F (36.8 C) (02/23 0530) Temp src: Oral (02/23 0530) BP: 133/83 mmHg (02/23 0530) Pulse Rate: 83 (02/23 0530)  Labs:  Recent Labs  07/27/12 0500  CREATININE 0.78   Estimated Creatinine Clearance: 192 ml/min (by C-G formula based on Cr of 0.78).  Recent Labs  07/27/12 0500 07/28/12 1100  VANCOTROUGH 5.1* 6.8*     Microbiology: 2/21 tissue culture: no growth 2 days  Anti-infectives: 2/19 >> Vanc >> 2/19 >> Zosyn >>  Assessment: 88 yoM with crush injury to left hand 05/09/12, surgical repair included pin fixation, with hardware removal 06/18/12.  MRI on 2/19 shows osteomyelitis.  POD2 L hand debridement (3.75 cm of excised, antibiotic spacer placed).  D#5 Vancomycin and Zosyn.    Patient requiring high doses of vancomycin due to age, weight.  Vancomycin trough below goal again despite increasing dose.  SCr relatively stable.  Goal of Therapy:  Vancomycin trough level 15-20 mcg/ml Appropriate abx dosing, eradication of infection.  Plan:   Increase vancomycin to 1750 mg IV q8h.  Will check trough level again tomorrow to ensure adequate dosing prior to anticipated discharge early next week.  Continue Zosyn 3.375g IV Q8H infused over 4hrs.  F/u SCr in AM.  Clance Boll, PharmD, BCPS Pager: [phone] 07/28/2012 12:25 PM

## 2012-07-28 NOTE — Evaluation (Addendum)
Occupational Therapy Evaluation Patient Details Name: Keith Nash MRN: [id number] DOB: 08-10-1985 Today's Date: 07/28/2012 Time: 1324-4010 OT Time Calculation (min): 24 min  OT Assessment / Plan / Recommendation Clinical Impression  Pt presents to OT s/p PARTIAL EXCISION 4TH METACARPAL/RADICAL EXCISION OF EXTENSOR BURSA/TENOSYNOVIUM. Pt with decreased functional use of L hand due to edema, pain, and limited ROm. Pt will benefit from skilled OT to encourage and educate pt in increasing mobility L hand and decreasing edema to improve function    OT Assessment  Patient needs continued OT Services    Follow Up Recommendations  Outpatient OT             Frequency  Min 5X/week           ADL  Transfers/Ambulation Related to ADLs: OT order for L hand finger ROM:  OT assessed and educated pt with PROM and AROM with L fingers.  Noted edema in all 5 fingers. Performed and educated pt in retrograde massage, as well as flexion and extension ROM of all 5 fingers including MCP, PIP and DIP joints ADL Comments: Pts 4th and 5 th finger most limited and most painful but were improved at end of OT session.  Instructed pt to perform retrograde massage, as well as finger PROM and AAROM every 2 hours today and to keep LUE elevated to prevent further edema.  Instructed pt to use lotion during retrograde massage.      OT Diagnosis: Acute pain  OT Problem List: Decreased range of motion;Decreased strength;Impaired UE functional use;Pain OT Treatment Interventions: Therapeutic exercise   OT Goals Acute Rehab OT Goals OT Goal Formulation: With patient Time For Goal Achievement: 08/04/12 Potential to Achieve Goals: Good ADL Goals Additional ADL Goal #1: Pt will perform AAROM/PROM with L hand to encourage flexion and extension of 5 fingers including  MCP, PIP and DIP joints, as well as perform retrograde massage to decrease edema and improve function of L hand I ly  Visit Information  Last OT  Received On: 07/28/12    Subjective Data  Subjective: Doctor said you would help me with my fingers            Extremity/Trunk Assessment Left Upper Extremity Assessment LUE Coordination: Deficits LUE Coordination Deficits: Decreased AROM noted with L fingers. Pt not able to oppose or grasp at this time with L hand due to limited ROM, pain and edema              End of Session OT - End of Session Activity Tolerance: Patient tolerated treatment well  GO     Alba Cory 07/28/2012, 1:34 PM

## 2012-07-29 ENCOUNTER — Encounter (HOSPITAL_COMMUNITY): Payer: Self-pay | Admitting: Orthopedic Surgery

## 2012-07-29 DIAGNOSIS — M869 Osteomyelitis, unspecified: Secondary | ICD-10-CM

## 2012-07-29 LAB — CREATININE, SERUM: GFR calc Af Amer: >90 mL/min (ref >90)

## 2012-07-29 LAB — TISSUE CULTURE
Culture: NO GROWTH
Gram Stain: NONE SEEN

## 2012-07-29 MED ORDER — CIPROFLOXACIN HCL 750 MG PO TABS
750.0000 mg | ORAL_TABLET | Freq: Two times a day (BID) | ORAL | Status: DC
  Administered 2012-07-29 – 2012-07-31 (×4): 750 mg via ORAL
  Filled 2012-07-29 (×6): qty 1

## 2012-07-29 MED ORDER — CIPROFLOXACIN HCL 750 MG PO TABS: 750.0000 mg | ORAL_TABLET | Freq: Two times a day (BID) | ORAL | Status: DC

## 2012-07-29 NOTE — Progress Notes (Signed)
Occupational Therapy Treatment Patient Details Name: Keith Nash MRN: [id number] DOB: 18-Oct-1985 Today's Date: 07/29/2012 Time: 4098-1191 OT Time Calculation (min): 30 min  OT Assessment / Plan / Recommendation Comments on Treatment Session Pt with increased AAROM and decreased edema noted.      Follow Up Recommendations    OPOT                           OT did measure PROM of L hand.  Measurements as follows    Flexion L Hand    MCP  PIP  DIP Digit 2       65  110  65 Digit 3      60  90  60 Digit 4       50  60  60 Digit 5        45  40  10- joint very stiff  Extension 2,3, and  4th digits able to fully extend with AAROM 5th digit very stiff and is -30 for full extension.  4th and 5th digits cause pt most pain.  Functional activity with L hand very limited at this time.    Pt is working on edema and PROM/AAROM during day and is motivated to get function back in L hand.      OT Goals ADL Goals Additional ADL Goal #1: progressing toward OT goals  Visit Information  Last OT Received On: 07/29/12          Cognition  Cognition Overall Cognitive Status: Appears within functional limits for tasks assessed/performed       Exercises  Hand Exercises Digit Composite Flexion: Left;AAROM Composite Extension: Left;AAROM Digit Composite Abduction: AAROM;Left Digit Composite Adduction: AAROM;Left Thumb Abduction: AROM;Left Thumb Adduction: AROM;Left Opposition: AROM;AAROM;Left      End of Session OT - End of Session Activity Tolerance: Patient tolerated treatment well Patient left: in chair       Keith Nash, Metro Kung 07/29/2012, 3:28 PM

## 2012-07-29 NOTE — Care Management Note (Signed)
  Page 2 of 2   07/29/2012     4:54:52 PM   CARE MANAGEMENT NOTE 07/29/2012  Patient:  Keith Nash, Keith Nash   Account Number:  [id number]  Date Initiated:  07/29/2012  Documentation initiated by:  Colleen Can  Subjective/Objective Assessment:   dx osteomyelitis left hand    Worker's comp AVWUJ#[id number] 05/09/2012  [facility] [employer]  [phone]  Employer [employer]  Adjuster-Vicki-[phone]     Action/Plan:   CM spoke with patient. Current plans are for patient to return to [facility] [place] [facility] [place]   Anticipated DC Date:     Anticipated DC Plan:  HOME W HOME HEALTH SERVICES         Choice offered to / List presented to:             Status of service:   Medicare Important Message given?   (If response is "NO", the following Medicare IM given date fields will be blank) Date Medicare IM given:   Date Additional Medicare IM given:    Discharge Disposition:    Per UR Regulation:    If discussed at Long Length of Stay Meetings, dates discussed:    Comments:  07/29/2012 Colleen Can BSN RN CCM [phone] CM has not received any call bcks from [employer] or covering person. Return call to adjuster-Vicki-[phone] who advises that Keith Katrinka Blazing is out of office on emergency business; states she has had contact with Keith Nash- [phone]. CM called Arline Asp who is in HR at [facility]. Arline Asp states the HR is currently waiting on callbacks from patient's attorney regarding pt's living situation. Arline Asp states she has talked with Dr Thompson(attending doctor regarding case. Currently no HH orders from ID regarding IV abx therapy at home. CM talked with attending MD regarding inability to set up home IV therapy till patient has place to go to after discharge. Awaiting worker's comp decision /HR decision regarding finding place for patient to stay.  They are currently waitng call bck from patient's attorney. CM will f/u   07/29/2012 Dory Peru CCM RN [phone] Pt currently receiving IV abx per picc line; anticipate home IV abx. Received call from attending doctor-Thompson who advised that ID doctor will be making determination and writing orders for home abx therapy. CM will awit orders for home abx therapy CM called adjuster- Vicki-[phone]-/advised of need to poss home IV abx; states she has been in contact with Creola Corn who is trying to get lodging arrangements set up for patient. Statres they will not be able to set up services till this is done. Contact phone number for Keith Nash-[phone]. TCT Keith Nash-advised that she was out of office; was transfereed to to voice mail-left msg.

## 2012-07-29 NOTE — Progress Notes (Signed)
Occupational Therapy Treatment Patient Details Name: Keith Nash MRN: [id number] DOB: 1985-11-19 Today's Date: 07/29/2012 Time: 4696-2952 OT Time Calculation (min): 13 min  OT Assessment / Plan / Recommendation Comments on Treatment Session tolerated increased PROM    Follow Up Recommendations  Outpatient OT             Frequency Min 5X/week   Plan Discharge plan remains appropriate           ADL  Transfers/Ambulation Related to ADLs: OT encouraged pt to perform AAROM LUE.  Pt with decreased edema today and increased ROM.  Most limitations in 4th and 5th finger. Will take measurements at next OT session.        OT Goals ADL Goals Additional ADL Goal #1: progressing toward OT goal  Visit Information  Last OT Received On: 07/29/12    Subjective Data  Subjective: I have done my exercise      Cognition  Cognition Overall Cognitive Status: Appears within functional limits for tasks assessed/performed    Mobility  Transfers Transfers: Sit to Stand;Stand to Sit Sit to Stand: 7: Independent    Exercises      Balance     End of Session  Pt left on couch   GO     Rekha Hobbins, Metro Kung 07/29/2012, 10:12 AM

## 2012-07-29 NOTE — Progress Notes (Signed)
Subjective: POD 3 L hand debridement (3.75 cm of excised, antibiotic spacer placed) Pain control good, working well on ROM  ID c/s rendered today, thanks.  Objective: Vital signs in last 24 hours: Temp:  [97.7 F (36.5 C)-98.3 F (36.8 C)] 98.3 F (36.8 C) (02/24 1500) Pulse Rate:  [65-75] 66 (02/24 1500) Resp:  [16-18] 16 (02/24 1500) BP: (142-145)/(55-90) 144/55 mmHg (02/24 1500) SpO2:  [100 %] 100 % (02/24 1500)  Intake/Output from previous day: 02/23 0701 - 02/24 0700 In: 3150 [P.O.:1560; I.V.:440; IV Piggyback:1150] Out: -  Intake/Output this shift: Total I/O In: 1680 [P.O.:1080; IV Piggyback:600] Out: -   No results found for this basename: HGB,  in the last 72 hours No results found for this basename: WBC, RBC, HCT, PLT,  in the last 72 hours  Recent Labs  07/27/12 0500 07/29/12 0325  CREATININE 0.78 0.72   No results found for this basename: LABPT, INR,  in the last 72 hours Incision benign, scant blood drainage NVI  Assessment/Plan: Cxs-NGTD  Path c/w acute osteo Continue vanc, d/c zosyn, add cipro D/C tomorrow to patient's PTA "residence," which is an [facility] hotel room.  The patient's long-term housing (local apartment being sought) is being negotiated by his employer, W/C carrier, and pt's attorney.  It may take days-to-weeks to materialize RTC me 2 weeks postop RTC Dr. Drue Second 2-3 weeks postop  Judith Demps A. 07/29/2012, 6:29 PM

## 2012-07-29 NOTE — Consult Note (Signed)
[facility]  Total days of antibiotics 6        Day 6 piptazo        Day 6 vanco               Reason for Consult: osteomyelitis of the hand    Referring Physician: thompson  Active Problems:   * No active hospital problems. *    HPI: Keith Nash is a 27 y.o. male with no significant past medical history who sustained injury to his 4th and metacarpal of left hand from industrial drill in early December. He reports having fracture to 4th and 5th fingers of left hand, open fracture and likely had water exposure from drilling. He is s/p open reduction with percutaneous pinning as well as subsequent removal of HW on 1/14. He presented as outpatient to Dr. Janee Morn for further management given concern for ongoing infection. He underwent MRI on 2/19 that showed ostemyelitis of 4th metacarpal with large phlegmon. He was started on the empiric regimen of vancomycin and piptazo. Keith Nash was taken to the OR on 2/21/ 14 Left hand debridement of phlegmon including 3.75 cm of 4th MC and surrounding soft-tissue with implantation of gentamicin cement spacer and central k-wire. He is anticipated to have IV antibiotics and likely revision of his finger in the future. Dr. Janee Morn has asked [place] to provide recommendations for antibiotics given that OR cultures are NGTD. Path c/w acute osteomyelitis    Past Medical History  Diagnosis Date  . Medical history non-contributory     Allergies: No Known Allergies    MEDICATIONS: . docusate sodium  100 mg Oral BID  . multivitamin with minerals  1 tablet Oral Daily  . naproxen  500 mg Oral BID WC  . nicotine  14 mg Transdermal Daily  . piperacillin-tazobactam (ZOSYN)  IV  3.375 g Intravenous Q8H  . vancomycin  1,750 mg Intravenous Q8H    History  Substance Use Topics  . Smoking status: Never Smoker   . Smokeless tobacco: Current User    Types: Chew  . Alcohol Use: Yes     Comment: on special occasions      History reviewed. No pertinent family history.  Review of Systems  Constitutional: Negative for fever, chills, diaphoresis, activity change, appetite change, fatigue and unexpected weight change.  HENT: Negative for congestion, sore throat, rhinorrhea, sneezing, trouble swallowing and sinus pressure.  Eyes: Negative for photophobia and visual disturbance.  Respiratory: Negative for cough, chest tightness, shortness of breath, wheezing and stridor.  Cardiovascular: Negative for chest pain, palpitations and leg swelling.  Gastrointestinal: Negative for nausea, vomiting, abdominal pain, diarrhea, constipation, blood in stool, abdominal distention and anal bleeding.  Genitourinary: Negative for dysuria, hematuria, flank pain and difficulty urinating.  Musculoskeletal: per hpi Skin: Negative for color change, pallor, rash and wound.  Neurological: Negative for dizziness, tremors, weakness and light-headedness.  Hematological: Negative for adenopathy. Does not bruise/bleed easily.  Psychiatric/Behavioral: Negative for behavioral problems, confusion, sleep disturbance, dysphoric mood, decreased concentration and agitation.      OBJECTIVE: Temp:  [97.7 F (36.5 C)-97.8 F (36.6 C)] 97.7 F (36.5 C) (02/24 0500) Pulse Rate:  [65-75] 65 (02/24 0500) Resp:  [16-18] 18 (02/24 0500) BP: (142-145)/(87-90) 142/90 mmHg (02/24 0500) SpO2:  [100 %] 100 % (02/24 0500) Physical Exam  Constitutional: He is oriented to person, place, and time. He appears well-developed and well-nourished. No distress.  HENT:  Mouth/Throat: Oropharynx is clear and moist. No oropharyngeal  exudate.  Cardiovascular: Normal rate, regular rhythm and normal heart sounds. Exam reveals no gallop and no friction rub.  No murmur heard.  Pulmonary/Chest: Effort normal and breath sounds normal. No respiratory distress. He has no wheezes.  Abdominal: Soft. Bowel sounds are normal. He exhibits no distension. There is no  tenderness.  Lymphadenopathy:  He has no cervical adenopathy.  Neurological: He is alert and oriented to person, place, and time.  Skin: Skin is warm and dry. No rash noted. No erythema.  Ext: left hand wrapped from surgery   LABS: Results for orders placed during the hospital encounter of 07/24/12 (from the past 48 hour(s))  VANCOMYCIN, TROUGH     Status: Abnormal   Collection Time    07/28/12 11:00 AM      Result Value Range   Vancomycin Tr 6.8 (*) 10.0 - 20.0 ug/mL  CREATININE, SERUM     Status: None   Collection Time    07/29/12  3:25 AM      Result Value Range   Creatinine, Ser 0.72  0.50 - 1.35 mg/dL   GFR calc non Af Amer >90  >90 mL/min   GFR calc Af Amer >90  >90 mL/min   Comment:            The eGFR has been calculated     using the CKD EPI equation.     This calculation has not been     validated in all clinical     situations.     eGFR's persistently     <90 mL/min signify     possible Chronic Kidney Disease.    MICRO: 2/21 wound cx NGTD, gram stain no organism IMAGING: 2/19 mri hand: Osteomyelitis of the fourth metacarpal with marked phlegmon in the  surrounding soft tissues. Healing fracture of the fifth metacarpal  shaft, with less pronounced inflammatory changes.   Assessment/Plan:  27yo Male with acute osteomyelitis of left hand of 4th metacarpal POD#3 s/p debridement and antibiotic spacer. Culture negative due to being on antibiotics at time of surgery.  - recommend 6 wks of IV antibiotics with vancomycin (trough 15-20) plus oral ciprofloxacin 750mg  BID - we will see him back in ID clinic in 2 wks plu 6 wks to monitor his progress and transition to oral antibiotics beyond 6 wks if needed.   Duke Salvia Drue Second MD MPH [facility] [phone]

## 2012-07-29 NOTE — Discharge Summary (Signed)
Physician Discharge Summary  Patient ID: Keith Nash MRN: [id number] DOB/AGE: 31-May-1986 27 y.o.  Admit date: 07/24/2012 Discharge date: 07/29/2012  Admission Diagnoses:  Left hand osteomyelitis  Discharge Diagnoses:  Active Problems:   * No active hospital problems. *   Past Medical History  Diagnosis Date  . Medical history non-contributory     Surgeries: Procedure(s): PARTIAL EXCISION 4TH METACARPAL/RADICAL EXCISION OF Phlegmon on 07/26/2012   Consultants (if any):    Discharged Condition: Improved  Hospital Course: Keith Nash is an 27 y.o. male who was admitted 07/24/2012 with a diagnosis of L hand osteomyelitis and went to the operating room on 07/26/12 and underwent the above named procedures.  Cxs were NGTD, Path c/w acute osteo.  ID c/s obtained and recs implemented.  Likely D/C on Tues 07-30-12.  He was given perioperative antibiotics:  Anti-infectives   Start     Dose/Rate Route Frequency Ordered Stop   07/29/12 2000  ciprofloxacin (CIPRO) tablet 750 mg     750 mg Oral 2 times daily 07/29/12 1759     07/28/12 2000  vancomycin (VANCOCIN) 1,750 mg in sodium chloride 0.9 % 500 mL IVPB     1,750 mg 250 mL/hr over 120 Minutes Intravenous Every 8 hours 07/28/12 1233     07/28/12 1300  vancomycin (VANCOCIN) 500 mg in sodium chloride 0.9 % 100 mL IVPB     500 mg 100 mL/hr over 60 Minutes Intravenous  Once 07/28/12 1225 07/28/12 1540   07/27/12 1200  vancomycin (VANCOCIN) 1,250 mg in sodium chloride 0.9 % 250 mL IVPB  Status:  Discontinued     1,250 mg 166.7 mL/hr over 90 Minutes Intravenous Every 8 hours 07/27/12 0607 07/28/12 1233   07/25/12 0600  vancomycin (VANCOCIN) 1,250 mg in sodium chloride 0.9 % 250 mL IVPB  Status:  Discontinued     1,250 mg 166.7 mL/hr over 90 Minutes Intravenous Every 12 hours 07/24/12 1606 07/27/12 0608   07/25/12 0200  piperacillin-tazobactam (ZOSYN) IVPB 3.375 g  Status:  Discontinued     3.375 g 12.5 mL/hr over 240  Minutes Intravenous Every 8 hours 07/24/12 2102 07/29/12 1759   07/24/12 1630  vancomycin (VANCOCIN) 2,000 mg in sodium chloride 0.9 % 500 mL IVPB     2,000 mg 250 mL/hr over 120 Minutes Intravenous  Once 07/24/12 1606 07/24/12 2056   07/24/12 1600  piperacillin-tazobactam (ZOSYN) IVPB 3.375 g  Status:  Discontinued     3.375 g 12.5 mL/hr over 240 Minutes Intravenous 3 times per day 07/24/12 1554 07/24/12 2102   07/24/12 1554  vancomycin (VANCOCIN) IVPB 1000 mg/200 mL premix  Status:  Discontinued     1,000 mg 200 mL/hr over 60 Minutes Intravenous Every 12 hours 07/24/12 1554 07/24/12 1602    .  He was given sequential compression devices, early ambulation for DVT prophylaxis.  He benefited maximally from the hospital stay and there were no complications.    Recent vital signs:  Filed Vitals:   07/29/12 1500  BP: 144/55  Pulse: 66  Temp: 98.3 F (36.8 C)  Resp: 16    Recent laboratory studies:  Lab Results  Component Value Date   HGB 15.1 07/24/2012   Lab Results  Component Value Date   WBC 6.7 07/24/2012   PLT 224 07/24/2012   Lab Results  Component Value Date   INR 1.17 07/24/2012   Lab Results  Component Value Date   NA 138 07/24/2012   K 4.0 07/24/2012   CL 100 07/24/2012  CO2 26 07/24/2012   BUN 12 07/24/2012   CREATININE 0.72 07/29/2012   GLUCOSE 89 07/24/2012    Discharge Medications:     Medication List    STOP taking these medications       ibuprofen 200 MG tablet  Commonly known as:  ADVIL,MOTRIN  Replaced by:  naproxen 500 MG tablet      TAKE these medications       naproxen 500 MG tablet  Commonly known as:  NAPROSYN  Take 1 tablet (500 mg total) by mouth 2 (two) times daily with a meal.     oxyCODONE-acetaminophen 5-325 MG per tablet  Commonly known as:  PERCOCET/ROXICET  Take 1-2 tablets by mouth every 4 (four) hours as needed.        Diagnostic Studies: Dg Hand 2 View Left  07/26/2012  *RADIOLOGY REPORT*  Clinical Data: Osteomyelitis  of the left hand.  DG C-ARM 1-60 MIN - NRPT [facility],LEFT HAND - 2 VIEW  Comparison: None.  Findings: Fluoroscopic spot films demonstrate placement of bone graft in the left hand with K-wire fixation.  Fifth metacarpal fracture is again noted.  Two intraoperative fluoroscopic spot views are submitted for interpretation.  IMPRESSION: Fourth metacarpal debridement with bone graft placement.   Original Report Authenticated By: Andreas Newport, M.D.    Mr Hand Left W Wo Contrast  07/25/2012  *RADIOLOGY REPORT*  Clinical Data: Injury of the left hand status post ORIF.  Hand swelling and inflammation.  Limited range of motion.  MRI OF THE LEFT HAND WITHOUT AND WITH CONTRAST  Technique:  Multiplanar, multisequence MR imaging was performed both before and after administration of intravenous contrast.  Contrast: 20mL MULTIHANCE GADOBENATE DIMEGLUMINE 529 MG/ML IV SOLN  Comparison: None.  Findings: Markedly abnormal fourth metacarpal compatible with prior fracture and secondary osteomyelitis.  Involucrum is present with thickening of the shaft and poor definition of the cortex.  No definite sequestrum is identified.  Florid enhancement after Gadolinium administration.  Fluid signal surrounds the fourth metacarpal on precontrast imaging however on postcontrast imaging, this enhances uniformly, without evidence of a discrete abscess. There is marked swelling of the fourth interosseous muscle.  Residual bone marrow edema is present in healing transverse fifth metacarpal shaft fracture.  K-wire fixation tract can still be seen in the fifth metacarpal.  There is a small fifth MCP joint effusion which is probably reactive. There is no convincing evidence of septic flexor or extensor tenosynovitis.  Infectious myositis surrounds the fourth metacarpal.  Cellulitis is also present.  IMPRESSION: Osteomyelitis of the fourth metacarpal with marked phlegmon in the surrounding soft tissues.  Healing fracture of the fifth metacarpal shaft,  with less pronounced inflammatory changes.   Original Report Authenticated By: Andreas Newport, M.D.    Dg C-arm 1-60 Min-no Report  07/26/2012  *RADIOLOGY REPORT*  Clinical Data: Osteomyelitis of the left hand.  DG C-ARM 1-60 MIN - NRPT [facility],LEFT HAND - 2 VIEW  Comparison: None.  Findings: Fluoroscopic spot films demonstrate placement of bone graft in the left hand with K-wire fixation.  Fifth metacarpal fracture is again noted.  Two intraoperative fluoroscopic spot views are submitted for interpretation.  IMPRESSION: Fourth metacarpal debridement with bone graft placement.   Original Report Authenticated By: Andreas Newport, M.D.     Disposition: Final discharge disposition not confirmed--suspect to "home" with [facility] IV antibiotics        Follow-up Information   Schedule an appointment as soon as possible for a visit with Janee Morn, Makhai Fulco A., MD. (10-15  days from surgery)    Contact information:   [address] [place] [address] [phone]       Follow up with Judyann Munson, MD. Schedule an appointment as soon as possible for a visit in 2 weeks.   Contact information:   [address] [place] [address] [phone]        Signed: Janee Morn, Brandalyn Harting A. 07/29/2012, 6:33 PM

## 2012-07-30 MED ORDER — VANCOMYCIN HCL 10 G IV SOLR
1500.0000 mg | Freq: Three times a day (TID) | INTRAVENOUS | Status: DC
  Administered 2012-07-30 – 2012-07-31 (×4): 1500 mg via INTRAVENOUS
  Filled 2012-07-30 (×5): qty 1500

## 2012-07-30 NOTE — Progress Notes (Signed)
Occupational Therapy Treatment Patient Details Name: Keith Nash MRN: [id number] DOB: December 13, 1985 Today's Date: 07/30/2012 Time: 4098-1191 OT Time Calculation (min): 19 min  OT Assessment / Plan / Recommendation Comments on Treatment Session Pt reports he has been doing exercises every 2 hours.  Reviewed single digit and composite movements for L digits.  5th digit is most restricted in extension and all MCPs tight.  Reinforced that PROM is to be gentle.  Reviewed positioning for edema management and retrograde massage.  Observed pt with exercises.  Did not observe retrograde massage but pt verbalizes technique      Follow Up Recommendations  Outpatient OT    Barriers to Discharge       Equipment Recommendations       Recommendations for Other Services    Frequency Min 5X/week   Plan Discharge plan remains appropriate    Precautions / Restrictions Precautions Precautions:  (AROM and gentle PROM)   Pertinent Vitals/Pain Pt reports pain is OK    ADL       OT Diagnosis:    OT Problem List:   OT Treatment Interventions:     OT Goals ADL Goals Additional ADL Goal #1: partially met  Visit Information  Last OT Received On: 07/30/12    Subjective Data      Prior Functioning       Cognition  Cognition Overall Cognitive Status: Appears within functional limits for tasks assessed/performed    Mobility  Transfers Sit to Stand: 7: Independent    Exercises  Hand Exercises Digit Composite Flexion: AROM;PROM and each joint Composite Extension: PROM;AROM and each joint Digit Composite Abduction: AROM Digit Composite Adduction: AROM Opposition: AROM (can oppose to 5th digit)   Balance     End of Session OT - End of Session Activity Tolerance: Patient tolerated treatment well Patient left: in chair  GO     Keith Nash 07/30/2012, 8:01 AM Marica Otter, OTR/L [phone] 07/30/2012

## 2012-07-30 NOTE — Progress Notes (Signed)
07/30/2012  1715 Colleen Can [phone] received tct from Lafonda Mosses at [facility] [employer] [phone]; she advised that she has called several HH agencies to provide [facility] services but have not received call bcks regarding whether they can provide [facility] services. She wll continue to work on case. Currently authorization for [facility] services is pending. CM will follow.

## 2012-07-30 NOTE — Progress Notes (Signed)
ANTIBIOTIC CONSULT NOTE - FOLLOW UP  Pharmacy Consult for Vancomycin, Zosyn Indication: hand osteomyelitis  No Known Allergies  Patient Measurements: Height: 6' (182.9 cm) Weight: 278 lb (126.1 kg) IBW/kg (Calculated) : 77.6  Vital Signs: Temp: 97.9 F (36.6 C) (02/24 2131) BP: 147/84 mmHg (02/24 2131) Pulse Rate: 67 (02/24 2131)  Labs:  Recent Labs  07/27/12 0500 07/29/12 0325  CREATININE 0.78 0.72   Estimated Creatinine Clearance: 192 ml/min (by C-G formula based on Cr of 0.72).  Recent Labs  07/28/12 1100 07/30/12 0300  VANCOTROUGH 6.8* 23.2*     Microbiology: 2/21 tissue culture: no growth 3 days (final)  Anti-infectives: 2/19 >> Vanc >> 2/19 >> Zosyn >> 2/24  Assessment: 26 yoM with crush injury to left hand 05/09/12, surgical repair included pin fixation, with hardware removal 06/18/12.  MRI on 2/19 shows osteomyelitis.  POD2 L hand debridement (3.75 cm of excised, antibiotic spacer placed).  D#7 Vancomycin (Zosyn d/c'ed on 2/24)  Patient requiring high doses of vancomycin due to age, weight.  Vancomycin trough now above goal (23.2 mcg/ml) after last dose increase to 1750mg  IV q8h. SCr relatively stable.  Spoke with RN and asked not to hang 4am dose  Goal of Therapy:  Vancomycin trough level 15-20 mcg/ml Appropriate abx dosing, eradication of infection.  Plan:   Decrease vancomycin to 1500 mg IV q8h.   Terrilee Files, PharmD 07/30/2012 4:04 AM

## 2012-07-30 NOTE — Progress Notes (Signed)
Comments:  07/30/2012 Colleen Can BSN RN CCM [phone] CM called adjuster-Vickie-([phone]) to inquire of status of [facility] services. She advised that 3rd party was working on setting up [facility] services for Home IV abx but case is pending. The goal is for completion this pm; they are aware of 10 pm dose that is due. States she will call when services are completed. Dr Janee Morn advised of [facility] services in pending state currently. Care Co-ordinator advised of the above.

## 2012-07-30 NOTE — Progress Notes (Signed)
ANTIBIOTIC CONSULT NOTE - FOLLOW UP  Pharmacy Consult for Vancomcyin Indication: Hand osteomyelitis  No Known Allergies  Patient Measurements: Height: 6' (182.9 cm) Weight: 278 lb (126.1 kg) IBW/kg (Calculated) : 77.6   Recent Labs  07/29/12 0325  CREATININE 0.72   Estimated Creatinine Clearance: 192 ml/min (by C-G formula based on Cr of 0.72).  Recent Labs  07/28/12 1100 07/30/12 0300  VANCOTROUGH 6.8* 23.2*     Anti-infective Dosing:   2/19 >> Zosyn >> 2/24 2/19 >> Vanc (2g load, then 1250mg  IV q12h) with trough level 5.1 mcg/ml >> d/c 2/21 2/21 >> Vanc (1250mg  IV q8h)  with trough level 6.8 mcg/ml >> d/c 2/23 2/23 >> Vanc (1750mg  IV q8h) with trough level 23.8 mcg/ml >> d/c 2/24 2/24 >> Vanc (1500mg  IV q8h) >>  2/24 >> Cipro >>    Assessment: 28 yoM with crush injury to left hand 05/09/12, surgical repair included pin fixation, with hardware removal 06/18/12. Presents to [facility] ED 2/19 with fever, chills, and concern for osteomyelitis.  S/p OR for debridement.  07/30/12 is Day #7 Vancomycin and Day #2 Cipro PO  SCr has remained stable with CrCl > 100 ml/min  Most recent vancomycin dose, 1500mg  IV q8h started 2/25.  Doses have been given 2/25 at 6:00am and 2:00pm.  Next doses are due at 10pm, 6am, and 2pm.  Vancomycin has required several dosage adjustments based on trough levels, and the most recent dose has not yet reached therapeutic levels.  Goal of Therapy:  Vancomycin trough level 15-20 mcg/ml  Plan:   Continue Vancomycin 1500mg  IV q8h  (scheduled at 6am, 2pm and 10pm)  Measure Vanc trough at steady state. -Before the 5th dose, 07/31/12 at 1pm.  Follow up renal fxn and adjust dose as needed.  Lynann Beaver PharmD, BCPS Pager [phone] 07/30/2012 1:59 PM

## 2012-07-30 NOTE — Progress Notes (Signed)
07/30/2012 Colleen Can BSN RN CCM [phone] Current plan is for patient to discharge to hotel where he and family are currently living. He will require Home IV abx x 6 weeks. Orders to be written by ID specialist. TCT Adjuster=Vicki who advised that orders needed to be faxed to [phone]/orders, progress notes, pharmacy notation, op note, copy of precriptions faxed with conformation. Advised that pt will also require blood drws as directed per notes. Spoke with Adjuster who states she has recieved faxed information and will assisgn to [employer] Sports coach. CM to follow.

## 2012-07-31 LAB — ANAEROBIC CULTURE

## 2012-07-31 MED ORDER — HEPARIN SOD (PORK) LOCK FLUSH 100 UNIT/ML IV SOLN
250.0000 [IU] | INTRAVENOUS | Status: AC | PRN
  Administered 2012-07-31: 250 [IU]

## 2012-07-31 MED ORDER — SULFAMETHOXAZOLE-TMP DS 800-160 MG PO TABS
2.0000 | ORAL_TABLET | Freq: Once | ORAL | Status: AC
  Administered 2012-07-31: 2 via ORAL
  Filled 2012-07-31: qty 2

## 2012-07-31 NOTE — Progress Notes (Signed)
Comments:  07/31/2012 Colleen Can BSN RN CCM [phone] CM made follow up at 0915 to [facility] to [facility] -left msg on voice mail asking for status of [facility] services arrangemnt. Received call from IV infusion company([employer]) that is providing IV abx-name of company-[employer]. They will f/u with [facility] [employer] [facility] regarding who will be providing HHrn. CM called [facility] at 1130, spoke with Lafonda Mosses who advised that she was still working on finding [facility] agency to provide [facility] services. CM called [employer] [facility] at 1600; spoke with [place] who advised that set up of HHrn was still pending.

## 2012-07-31 NOTE — Progress Notes (Signed)
Occupational Therapy Discharge Note Patient Details Name: Keith Nash MRN: [id number] DOB: April 16, 1986 Today's Date: 07/31/2012 Time: 5621-3086 OT Time Calculation (min): 26 min  OT Assessment / Plan / Recommendation   Comments on Treatment Session Pt reports he has been doing exercises every 2 hours.  Reviewed single digit and composite movements for L digits.  5th digit is most restricted in extension and all MCPs tight.  Reinforced that PROM is to be gentle.  Reviewed positioning for edema management and retrograde massage.  Observed pt with exercises.  Did observe retrograde massage.    Follow Up Recommendations  Outpatient OT             Frequency Min 5X/week   Plan Discharge plan remains appropriate           ADL     Pt I with ADL activity and does verbalize understanding of importance of home health antibiotics and follow up.    OT Goals ADL Goals Additional ADL Goal #1: goals met   Visit Information  Last OT Received On: 07/31/12                Exercises  Hand Exercises Digit Composite Flexion: AROM;PROM Composite Extension: PROM;AROM Digit Composite Abduction: AROM Digit Composite Adduction: AROM Opposition: AROM (can oppose to 5th digit)      End of Session OT - End of Session Activity Tolerance: Patient tolerated treatment well Patient left: in chair  GO     Keith Nash, Metro Kung 07/31/2012, 9:25 AM

## 2012-07-31 NOTE — Progress Notes (Signed)
ANTIBIOTIC CONSULT NOTE - FOLLOW UP  Pharmacy Consult for Vancomcyin Indication: Hand osteomyelitis  No Known Allergies  Patient Measurements: Height: 6' (182.9 cm) Weight: 278 lb (126.1 kg) IBW/kg (Calculated) : 77.6   Recent Labs  07/29/12 0325  CREATININE 0.72   Estimated Creatinine Clearance: 192 ml/min (by C-G formula based on Cr of 0.72).  Recent Labs  07/28/12 1100 07/30/12 0300  VANCOTROUGH 6.8* 23.2*     Anti-infective Dosing:   2/19 >> Zosyn >> 2/24 2/19 >> Vanc (2g load, then 1250mg  IV q12h) with trough level 5.1 mcg/ml >> d/c 2/21 2/21 >> Vanc (1250mg  IV q8h)  with trough level 6.8 mcg/ml >> d/c 2/23 2/23 >> Vanc (1750mg  IV q8h) with trough level 23.8 mcg/ml >> d/c 2/24 2/24 >> Vanc (1500mg  IV q8h) >>  2/24 >> Cipro >>   Assessment: 45 yoM with crush injury to left hand 05/09/12, surgical repair included pin fixation, with hardware removal 06/18/12. Presents to [facility] ED 2/19 with fever, chills, and concern for osteomyelitis.  S/p OR for debridement.  07/31/12 is Day #8 Vancomycin and Day #3 Cipro PO  SCr has remained stable with CrCl > 100 ml/min  Most recent vancomycin dose, 1500mg  IV q8h was started 2/25 (given at 6am, 2pm, and 10pm.)  Doses given 2/26:  6am dose only.    Vancomycin has required several dosage adjustments based on trough levels (see above section), and the most recent dose has not yet reached therapeutic levels.  I have spoken with the pharmacy staff at [facility] of [facility] [phone]) and clarified the dosing schedule.  Goal of Therapy:  Vancomycin trough level 15-20 mcg/ml  Plan:   Continue Vancomycin 1500mg  IV q8h  (scheduled at 6am, 2pm and 10pm).  Next doses are due at 2pm and 10pm today.  Measure Vanc trough at steady state.  The trough level should be obtained before the next dose 07/31/12 at 2pm or the next available time.   Lynann Beaver PharmD, BCPS Pager [phone] Pharmacy [phone] 07/31/2012 10:53  AM

## 2012-08-13 ENCOUNTER — Encounter: Payer: Self-pay | Admitting: Internal Medicine

## 2012-08-13 ENCOUNTER — Ambulatory Visit (INDEPENDENT_AMBULATORY_CARE_PROVIDER_SITE_OTHER): Payer: Self-pay | Admitting: Internal Medicine

## 2012-08-13 VITALS — BP 160/98 | HR 80 | Temp 97.5°F | Ht 73.0 in | Wt 281.0 lb

## 2012-08-13 DIAGNOSIS — M86142 Other acute osteomyelitis, left hand: Secondary | ICD-10-CM

## 2012-08-13 DIAGNOSIS — M86149 Other acute osteomyelitis, unspecified hand: Secondary | ICD-10-CM

## 2012-08-13 LAB — CBC WITH DIFFERENTIAL/PLATELET
Basophils Relative: 2 % — ABNORMAL HIGH (ref 0–1)
Eosinophils Absolute: 0.3 10*3/uL (ref 0.0–0.7)
MCH: 27.7 pg (ref 26.0–34.0)
MCHC: 33.7 g/dL (ref 30.0–36.0)
Neutrophils Relative %: 49 % (ref 43–77)
Platelets: 296 10*3/uL (ref 150–400)
RBC: 4.94 MIL/uL (ref 4.22–5.81)
RDW: 14.8 % (ref 11.5–15.5)

## 2012-08-13 NOTE — Progress Notes (Signed)
Labs  drawn from PICC in Right arm per Dr Drue Second.   PICC dressing changed and extension set added per Dr Drue Second. PICC site unremarkable.  Pt tolerated procedure well.     Laurell Josephs, RN

## 2012-08-13 NOTE — Progress Notes (Signed)
[facility] CLINIC NOTE  RFV: hospital follow up to left hand osteomyelitis  Subjective:    Patient ID: Keith Nash, male    DOB: 03/19/1986, 27 y.o.   MRN: [id number]  HPI Keith Nash is a 27 y.o. male  sustained injury to his 4th and metacarpal of left hand from industrial drill in early December. He reports having fracture to 4th and 5th fingers of left hand, open fracture and likely had water exposure from drilling. He is s/p open reduction with percutaneous pinning as well as subsequent removal of HW on 1/14. He presented as outpatient to Dr. Janee Morn for further management given concern for ongoing infection. He underwent MRI on 2/19 that showed ostemyelitis of 4th metacarpal with large phlegmon. He was started on the empiric regimen of vancomycin and piptazo and on 2/21/ 14 s/p Left hand debridement of phlegmon including 3.75 cm of 4th MC and surrounding soft-tissue with implantation of gentamicin cement spacer and central k-wire. with likely revision of his finger in the future. Dr. Janee Morn has asked [place] to provide recommendations for antibiotics given that OR cultures are NGTD. Path c/w acute osteomyelitis. He was discharged on vancomycin 1500mg  Q 8hr and cipro 750mg  BID. Sed rate and crp normal.  He is here on follow-up with case manager and 2 interpreters. He is on day 18 of 42 of IV antibiotics, vancomycin, taking 6am 2pm and 10pm. Routinely. His wife has been doing his infusions. She will be returning to [place] in 2 wks and is wondering if someone can teach him how to do the infusion.  He denies any discomfort with his picc line, he does notice the sensation of the infusion at the start of each dose. No rash. No diarrhea. No fungal skin infection.  Home health comes to him once a week but did not come yesterday for blood work or dressing change  Current Outpatient Prescriptions on File Prior to Visit  Medication Sig Dispense Refill  . ciprofloxacin (CIPRO) 750 MG tablet  Take 1 tablet (750 mg total) by mouth 2 (two) times daily.  84 tablet  0  . naproxen (NAPROSYN) 500 MG tablet Take 1 tablet (500 mg total) by mouth 2 (two) times daily with a meal.  60 tablet  1  . oxyCODONE-acetaminophen (PERCOCET/ROXICET) 5-325 MG per tablet Take 1-2 tablets by mouth every 4 (four) hours as needed.  80 tablet  0   No current facility-administered medications on file prior to visit.   Active Ambulatory Problems    Diagnosis Date Noted  . No Active Ambulatory Problems   Resolved Ambulatory Problems    Diagnosis Date Noted  . No Resolved Ambulatory Problems   Past Medical History  Diagnosis Date  . Medical history non-contributory    History   Social History  . Marital Status: Married    Spouse Name: N/A    Number of Children: N/A  . Years of Education: N/A   Occupational History  . Not on file.   Social History Main Topics  . Smoking status: Never Smoker   . Smokeless tobacco: Current User    Types: Chew  . Alcohol Use: Yes     Comment: on special occasions  . Drug Use: No  . Sexually Active: Not on file   Other Topics Concern  . Not on file   Social History Narrative  . No narrative on file   family history is not on file.   Review of Systems Review of Systems  Constitutional: Negative for  fever, chills, diaphoresis, activity change, appetite change, fatigue and unexpected weight change.  HENT: Negative for congestion, sore throat, rhinorrhea, sneezing, trouble swallowing and sinus pressure.  Eyes: Negative for photophobia and visual disturbance.  Respiratory: Negative for cough, chest tightness, shortness of breath, wheezing and stridor.  Cardiovascular: Negative for chest pain, palpitations and leg swelling.  Gastrointestinal: Negative for nausea, vomiting, abdominal pain, diarrhea, constipation, blood in stool, abdominal distention and anal bleeding.  Genitourinary: Negative for dysuria, hematuria, flank pain and difficulty urinating.   Musculoskeletal: Negative for myalgias, back pain, joint swelling, arthralgias and gait problem.  Skin: Negative for color change, pallor, rash and wound.  Neurological: Negative for dizziness, tremors, weakness and light-headedness.  Hematological: Negative for adenopathy. Does not bruise/bleed easily.  Psychiatric/Behavioral: Negative for behavioral problems, confusion, sleep disturbance, dysphoric mood, decreased concentration and agitation.  -reports having more perspiration that he has noticed since starting antibiotics.       Objective:   Physical Exam BP 160/98  Pulse 80  Temp(Src) 97.5 F (36.4 C) (Oral)  Ht 6\' 1"  (1.854 m)  Wt 281 lb (127.461 kg)  BMI 37.08 kg/m2 Physical Exam  Constitutional: He is oriented to person, place, and time. He appears well-developed and well-nourished. No distress.  HENT:  Mouth/Throat: Oropharynx is clear and moist. No oropharyngeal exudate.  Cardiovascular: Normal rate, regular rhythm and normal heart sounds. Exam reveals no gallop and no friction rub.  No murmur heard.  Pulmonary/Chest: Effort normal and breath sounds normal. No respiratory distress. He has no wheezes.  Lymphadenopathy: no cervical adenopathy.  Neurological: when he clenches his left hand does not have full range of flexion on 4th digit Skin: left hand dorsum of hand mildly still swollen onlong suture line. Suture are well healed. Mild abrasive shallow ulcer lesion on most proximal suture. No erythema Ext: right picc line, dressing is undone, but no erythema or tenderness, c/d/i      Assessment & Plan:  Left hand osteomyelitis = on empiric regimen of vancomycin and ciprofloxacin. Currently on day 18 of 42. Continue until April 2nd. We will see him back on that day to assess if he needs oral regimen for chronic suppression and will pull picc line at that time  In today's clinic visit -> we will  Check cbc, bmp, and vanco trough.   We will need to find his home health  agency to relay information and to ensure that they need to continue to come out on a weekly basis. They will also need to do infusion education to the patient in 2 wks plus also give IV picc line extension so that he can to do the last week of infusion on his own.  Hand appears to be healing well. Still has retained sutures. Will set up appt with dr. Janee Morn for hte patient. He has appt 3/12 at 2:30pm..  Cc: thompson

## 2012-08-14 ENCOUNTER — Telehealth: Payer: Self-pay | Admitting: *Deleted

## 2012-08-14 LAB — BASIC METABOLIC PANEL
Calcium: 9.6 mg/dL (ref 8.4–10.5)
Creat: 0.86 mg/dL (ref 0.50–1.35)
Sodium: 138 mEq/L (ref 135–145)

## 2012-08-14 LAB — VANCOMYCIN, TROUGH: Vancomycin Tr: 27.8 ug/mL — ABNORMAL HIGH (ref 10.0–20.0)

## 2012-08-14 NOTE — Telephone Encounter (Signed)
Spoke with Jackquline Berlin at patient's home health agency [phone] and gave orders per previous note, also faxed orders to [phone]. Also called case worker Leane Call at [phone] and asked her to call me so I can let her know of the change in antibiotic order. Wendall Mola CMA

## 2012-08-14 NOTE — Telephone Encounter (Signed)
Message copied by Macy Mis on Wed Aug 14, 2012 11:56 AM ------      Message from: Judyann Munson      Created: Wed Aug 14, 2012  9:36 AM       Please call the home health folks, to give them a copy/fax these results to them. They will need to reduce the dose of vancomycin to 1250mg  Q 8hrs as soon as they can. And recheck his blood work (bmp, vanco trough) no later than 5 days from change. Can you also document a phone note that we gave them these instructions. Thanks. ------

## 2012-08-16 ENCOUNTER — Other Ambulatory Visit: Payer: Self-pay | Admitting: *Deleted

## 2012-08-16 ENCOUNTER — Other Ambulatory Visit: Payer: Self-pay | Admitting: Internal Medicine

## 2012-08-16 DIAGNOSIS — M869 Osteomyelitis, unspecified: Secondary | ICD-10-CM

## 2012-08-16 MED ORDER — FLUCONAZOLE 200 MG PO TABS: 400.0000 mg | ORAL_TABLET | Freq: Every day | ORAL | Status: DC

## 2012-08-16 NOTE — Progress Notes (Signed)
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 We will contact patient to see which pharmacy to send his medication to, and also alert his social worker as this is part of worker's comp

## 2012-08-19 LAB — FUNGUS CULTURE W SMEAR: Fungal Smear: NONE SEEN

## 2012-09-03 ENCOUNTER — Telehealth: Payer: Self-pay | Admitting: Licensed Clinical Social Worker

## 2012-09-03 NOTE — Telephone Encounter (Signed)
RN going to go back to the home and try to draw blood peripherally since she was unable to collect blood out of the picc.

## 2012-09-03 NOTE — Telephone Encounter (Signed)
Rn called stating that she could not get labs out of picc today, she did add an extension to his picc so he can administer his antibiotics himself.

## 2012-09-10 ENCOUNTER — Encounter: Payer: Self-pay | Admitting: Internal Medicine

## 2012-09-10 ENCOUNTER — Ambulatory Visit (INDEPENDENT_AMBULATORY_CARE_PROVIDER_SITE_OTHER): Payer: Worker's Compensation | Admitting: Internal Medicine

## 2012-09-10 VITALS — BP 146/94 | HR 83 | Temp 98.1°F | Wt 289.0 lb

## 2012-09-10 DIAGNOSIS — M869 Osteomyelitis, unspecified: Secondary | ICD-10-CM

## 2012-09-10 LAB — C-REACTIVE PROTEIN: CRP: <0.5 mg/dL (ref <0.60)

## 2012-09-10 LAB — COMPREHENSIVE METABOLIC PANEL
ALT: 67 U/L — ABNORMAL HIGH (ref 0–53)
Albumin: 4.4 g/dL (ref 3.5–5.2)
Alkaline Phosphatase: 93 U/L (ref 39–117)
Glucose, Bld: 110 mg/dL — ABNORMAL HIGH (ref 70–99)
Potassium: 4.1 mEq/L (ref 3.5–5.3)
Sodium: 137 mEq/L (ref 135–145)
Total Protein: 7.4 g/dL (ref 6.0–8.3)

## 2012-09-10 LAB — CBC WITH DIFFERENTIAL/PLATELET
Basophils Relative: 1 % (ref 0–1)
Hemoglobin: 14.7 g/dL (ref 13.0–17.0)
Lymphs Abs: 2.3 10*3/uL (ref 0.7–4.0)
MCHC: 34.3 g/dL (ref 30.0–36.0)
Monocytes Relative: 9 % (ref 3–12)
Neutro Abs: 2.6 10*3/uL (ref 1.7–7.7)
Neutrophils Relative %: 46 % (ref 43–77)
Platelets: 238 10*3/uL (ref 150–400)
RBC: 5.28 MIL/uL (ref 4.22–5.81)

## 2012-09-10 MED ORDER — FLUCONAZOLE 200 MG PO TABS: 400.0000 mg | ORAL_TABLET | Freq: Every day | ORAL | Status: DC

## 2012-09-10 MED ORDER — SULFAMETHOXAZOLE-TMP DS 800-160 MG PO TABS: 1.0000 | ORAL_TABLET | Freq: Two times a day (BID) | ORAL | Status: DC

## 2012-09-10 MED ORDER — CIPROFLOXACIN HCL 750 MG PO TABS: 750.0000 mg | ORAL_TABLET | Freq: Two times a day (BID) | ORAL | Status: DC

## 2012-09-10 NOTE — Progress Notes (Signed)
RN received verbal order to discontinue the patient's PICC line.  Patient identified with name and date of birth using Spanish interpreter.  PICC dressing removed, site unremarkable.  PICC line removed using sterile procedure @ 1030. PICC length equal to that noted in patient's hospital chart of 47 cm. Sterile petroleum gauze + sterile 4X4 applied to PICC site, pressure applied for 10 minutes and covered with Medipore tape as a pressure dressing. Patient tolerated procedure without complaints.  Using Spanish interpreter patient instructed to limit use of arm for 1 hour and instructed that the pressure dressing should remain in place for 24 hours. Patient verbalized understanding of these instructions through the Spanish interpreter.

## 2012-09-10 NOTE — Progress Notes (Signed)
[facility] NOTE  RFV: follow up visit for finger osteomyelitis  Subjective:    Patient ID: Keith Nash, male    DOB: May 03, 1986, 27 y.o.   MRN: [id number]  HPI Keith Nash is a 27 y.o. male sustained injury to his 4th and metacarpal of left hand from industrial drill in early December. He reports having fracture to 4th and 5th fingers of left hand, open fracture and likely had water exposure from drilling. He is s/p open reduction with percutaneous pinning as well as subsequent removal of HW on 1/14. He presented as outpatient to Dr. Janee Morn for further management given concern for ongoing infection. He underwent MRI on 2/19 that showed ostemyelitis of 4th metacarpal with large phlegmon On 07/26/12, he underwent Left hand debridement of phlegmon including 3.75 cm of 4th MC and surrounding soft-tissue with implantation of gentamicin cement spacer and central k-wire. his OR cultures were NGTD but was previously on vanco and piptazo prior to surgery. Path c/w acute osteomyelitis. He was discharged on vancomycin 1500mg  Q 8hr and cipro 750mg  BID. Cultures did identify c.albicans on 3/14 where  fluconazole 400mg  daily which was added. He reports never picking up the medications. He has finished 7 wks of IV vancomycin as of today.He denies any discomfort with his picc line, he does notice the sensation of the infusion at the start of each dose. No rash. No diarrhea. His left hand is healing well. Little pain.  He rarely takes naprosyn.  Current Outpatient Prescriptions on File Prior to Visit  Medication Sig Dispense Refill  . ciprofloxacin (CIPRO) 750 MG tablet Take 1 tablet (750 mg total) by mouth 2 (two) times daily.  84 tablet  0  . naproxen (NAPROSYN) 500 MG tablet Take 1 tablet (500 mg total) by mouth 2 (two) times daily with a meal.  60 tablet  1  . oxyCODONE-acetaminophen (PERCOCET/ROXICET) 5-325 MG per tablet Take 1-2 tablets by mouth every 4 (four) hours as needed.  80 tablet  0   . sodium chloride 0.9 % SOLN 250 mL with vancomycin 1000 MG SOLR 1,000 mg Inject 1,000 mg into the vein every 12 (twelve) hours.      . fluconazole (DIFLUCAN) 200 MG tablet Take 2 tablets (400 mg total) by mouth daily.  60 tablet  5   No current facility-administered medications on file prior to visit.      Review of Systems  Constitutional: Negative for fever, chills, diaphoresis, activity change, appetite change, fatigue and unexpected weight change.  HENT: Negative for congestion, sore throat, rhinorrhea, sneezing, trouble swallowing and sinus pressure.  Eyes: Negative for photophobia and visual disturbance.  Respiratory: Negative for cough, chest tightness, shortness of breath, wheezing and stridor.  Cardiovascular: Negative for chest pain, palpitations and leg swelling.  Gastrointestinal: Negative for nausea, vomiting, abdominal pain, diarrhea, constipation, but occ. Red blood streaked stool. 1 episode 2 wks ago, and another episode last week.  No blood in the toilet bowl. No recent episodes Genitourinary: Negative for dysuria, hematuria, flank pain and difficulty urinating.  Musculoskeletal: Negative for myalgias, back pain, joint swelling, arthralgias and gait problem.  Skin: Negative for color change, pallor, rash and wound.  Neurological: Negative for dizziness, tremors, weakness and light-headedness.  Hematological: Negative for adenopathy. Does not bruise/bleed easily.  Psychiatric/Behavioral: Negative for behavioral problems, confusion, sleep disturbance, dysphoric mood, decreased concentration and agitation.       Objective:   Physical Exam BP 146/94  Pulse 83  Temp(Src) 98.1 F (36.7 C) (Oral)  Wt  289 lb (131.09 kg)  BMI 38.14 kg/m2 Physical Exam  Constitutional: He is oriented to person, place, and time. He appears well-developed and well-nourished. No distress.  HENT:  Mouth/Throat: Oropharynx is clear and moist. No oropharyngeal exudate.  Cardiovascular: Normal  rate, regular rhythm and normal heart sounds. Exam reveals no gallop and no friction rub.  No murmur heard.  Pulmonary/Chest: Effort normal and breath sounds normal. No respiratory distress. He has no wheezes.  Abdominal: Soft. Bowel sounds are normal. He exhibits no distension. There is no tenderness.  Lymphadenopathy:  He has no cervical adenopathy.  Ext: left dorsum of hand still slightly edematous, well healed surgical incision site. Nontender. Neuro + 4th digit of left hand unable to full extend Skin: Skin is warm and dry. No rash noted. No erythema.    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 Change oral antibiotics cipro and bactrim for 2 addn weeks to treat osteo for 8 wks. But will have to continue with fluconazole for 6 months. All prescriptions have been sent to pharmacy and asked to be printed in spanish. His interpreters are here today to explain how to take medicines  BRBPR/hemorrhoidal bleeding = appears infrequent. Will ask him to stop taking naprosyn. Will check cbc to see  If any significant blood loose. Will check in next 2 wks to see if recurrence to determine if need to refer to GI. Asked him to drink water, keep hydrated.  rtc in 2 wk

## 2012-09-24 ENCOUNTER — Encounter: Payer: Self-pay | Admitting: Internal Medicine

## 2012-09-24 ENCOUNTER — Ambulatory Visit (INDEPENDENT_AMBULATORY_CARE_PROVIDER_SITE_OTHER): Payer: Worker's Compensation | Admitting: Internal Medicine

## 2012-09-24 VITALS — BP 144/87 | HR 73 | Temp 97.4°F | Wt 292.0 lb

## 2012-09-24 DIAGNOSIS — R7402 Elevation of levels of lactic acid dehydrogenase (LDH): Secondary | ICD-10-CM

## 2012-09-24 DIAGNOSIS — M869 Osteomyelitis, unspecified: Secondary | ICD-10-CM

## 2012-09-24 LAB — COMPREHENSIVE METABOLIC PANEL
ALT: 73 U/L — ABNORMAL HIGH (ref 0–53)
BUN: 14 mg/dL (ref 6–23)
CO2: 24 mEq/L (ref 19–32)
Calcium: 9.6 mg/dL (ref 8.4–10.5)
Chloride: 104 mEq/L (ref 96–112)
Creat: 1.14 mg/dL (ref 0.50–1.35)
Glucose, Bld: 101 mg/dL — ABNORMAL HIGH (ref 70–99)
Total Bilirubin: 0.4 mg/dL (ref 0.3–1.2)

## 2012-09-24 NOTE — Progress Notes (Signed)
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   MRN: [id number]  HPI Keith Nash is a 27 y.o. male sustained injury to his 4th and metacarpal of left hand from industrial drill in early December. He reports having fracture to 4th and 5th fingers of left hand, open fracture and likely had water exposure from drilling. He is s/p open reduction with percutaneous pinning as well as subsequent removal of HW on 1/14. He presented as outpatient to Dr. Janee Morn for further management given concern for ongoing infection. He underwent MRI on 2/19 that showed ostemyelitis of 4th metacarpal with large phlegmon. On 07/26/12, he underwent Left hand debridement of phlegmon including 3.75 cm of 4th MC and surrounding soft-tissue with implantation of gentamicin cement spacer and central k-wire. his OR cultures were initially NGTD but was previously on vanco and piptazo prior to surgery but subsequently identified c.albicans on 3/14.14. Path c/w acute osteomyelitis. He was discharged on vancomycin 1500mg  Q 8hr and cipro 750mg  BID.  fluconazole 400mg  daily which was added.  He had finished 7 wks of IV vancomycin as of 09/09/12 then switched to bactrim,and ciprofloxacin for a total of 8 wks with plan of fluconazole for 6 months  His left hand is healing well. Little pain.   He continues to work with physical therapy.   Current Outpatient Prescriptions on File Prior to Visit  Medication Sig Dispense Refill  . sulfamethoxazole-trimethoprim (BACTRIM DS) 800-160 MG per tablet Take 1 tablet by mouth 2 (two) times daily.  28 tablet  0  . ciprofloxacin (CIPRO) 750 MG tablet Take 1 tablet (750 mg total) by mouth 2 (two) times daily.  28 tablet  0  . fluconazole (DIFLUCAN) 200 MG tablet Take 2 tablets (400 mg total) by mouth daily.  60 tablet  5  . naproxen (NAPROSYN) 500 MG tablet Take  1 tablet (500 mg total) by mouth 2 (two) times daily with a meal.  60 tablet  1  . oxyCODONE-acetaminophen (PERCOCET/ROXICET) 5-325 MG per tablet Take 1-2 tablets by mouth every 4 (four) hours as needed.  80 tablet  0  . sodium chloride 0.9 % SOLN 250 mL with vancomycin 1000 MG SOLR 1,000 mg Inject 1,000 mg into the vein every 12 (twelve) hours.       No current facility-administered medications on file prior to visit.   Active Ambulatory Problems    Diagnosis Date Noted  . 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 No Resolved Ambulatory Problems   Past Medical History  Diagnosis Date  . Medical history non-contributory    History  Substance Use Topics  . Smoking status: Never Smoker   . Smokeless tobacco: Current User    Types: Chew  . Alcohol Use: Yes     Comment: on special occasions   Family hx unchanged   Review of Systems  Constitutional: Negative for fever, chills, diaphoresis, activity change, appetite change, fatigue and unexpected weight change.  HENT: Negative for congestion, sore throat, rhinorrhea, sneezing, trouble swallowing and sinus pressure.  Eyes: Negative for photophobia and visual disturbance.  Respiratory: Negative for cough, chest tightness, shortness of breath, wheezing and stridor.  Cardiovascular: Negative for chest pain, palpitations and leg swelling.  Gastrointestinal: Negative for nausea, vomiting, abdominal pain, diarrhea, constipation, blood in stool, abdominal distention and anal bleeding.  Genitourinary: Negative  for dysuria, hematuria, flank pain and difficulty urinating.  Musculoskeletal: Negative for myalgias, back pain, joint swelling, arthralgias and gait problem.  Skin: Negative for color change, pallor, rash and wound.  Neurological: Negative for dizziness, tremors, weakness and light-headedness.  Hematological: Negative for adenopathy. Does not bruise/bleed easily.   Psychiatric/Behavioral: Negative for behavioral problems, confusion, sleep disturbance, dysphoric mood, decreased concentration and agitation.       Objective:   Physical Exam  BP 144/87  Pulse 73  Temp(Src) 97.4 F (36.3 C) (Oral)  Wt 292 lb (132.45 kg)  BMI 38.53 kg/m2 Physical Exam  Constitutional: He is oriented to person, place, and time. He appears well-developed and well-nourished. No distress.  HENT:  Mouth/Throat: Oropharynx is clear and moist. No oropharyngeal exudate.  Cardiovascular: Normal rate, regular rhythm and normal heart sounds. Exam reveals no gallop and no friction rub.  No murmur heard.  Pulmonary/Chest: Effort normal and breath sounds normal. No respiratory distress. He has no wheezes.  Lymphadenopathy:  no cervical adenopathy.  Neurological: normal grip strength 5/5. Slight decrease in left hand Skin: Skin is warm and dry. No rash noted. No erythema.  Ext: left dorsum of hand swelling on ulnar aspect Psychiatric: He has a normal mood and affect. His behavior is normal.   Labs: Lab Results  Component Value Date   CRP <0.5 09/10/2012   Lab Results  Component Value Date   ESRSEDRATE 1 09/10/2012       Assessment & Plan:  Fungal osteomyelitis of left hand = will continue with fluconazole 400mg  daily for 6 months, with end date of 03/09/2013. Can stop taking ciprofloxacin and bactrim.  transaminitis = patient had slight elevation at baseline of ast and alt. Will check cmp today to see if any worsening with azoles.  Spent greater than 35 min with 50% counseling and coordination.  rtc in 3 months

## 2012-12-24 ENCOUNTER — Ambulatory Visit: Payer: Self-pay | Admitting: Internal Medicine

## 2012-12-31 ENCOUNTER — Encounter: Payer: Self-pay | Admitting: Internal Medicine

## 2012-12-31 ENCOUNTER — Ambulatory Visit (INDEPENDENT_AMBULATORY_CARE_PROVIDER_SITE_OTHER): Payer: Self-pay | Admitting: Internal Medicine

## 2012-12-31 VITALS — BP 151/96 | HR 83 | Temp 97.6°F | Wt 276.0 lb

## 2012-12-31 DIAGNOSIS — M869 Osteomyelitis, unspecified: Secondary | ICD-10-CM

## 2012-12-31 LAB — CBC WITH DIFFERENTIAL/PLATELET
Basophils Absolute: 0.1 K/uL (ref 0.0–0.1)
Basophils Relative: 1 % (ref 0–1)
Eosinophils Absolute: 0.4 K/uL (ref 0.0–0.7)
Eosinophils Relative: 5 % (ref 0–5)
HCT: 46.1 % (ref 39.0–52.0)
Hemoglobin: 16.1 g/dL (ref 13.0–17.0)
Lymphocytes Relative: 39 % (ref 12–46)
Lymphs Abs: 2.8 K/uL (ref 0.7–4.0)
MCH: 28.5 pg (ref 26.0–34.0)
MCHC: 34.9 g/dL (ref 30.0–36.0)
MCV: 81.6 fL (ref 78.0–100.0)
Monocytes Absolute: 0.6 K/uL (ref 0.1–1.0)
Monocytes Relative: 9 % (ref 3–12)
Neutro Abs: 3.3 K/uL (ref 1.7–7.7)
Neutrophils Relative %: 46 % (ref 43–77)
Platelets: 236 K/uL (ref 150–400)
RBC: 5.65 MIL/uL (ref 4.22–5.81)
RDW: 14.8 % (ref 11.5–15.5)
WBC: 7.2 K/uL (ref 4.0–10.5)

## 2012-12-31 LAB — COMPREHENSIVE METABOLIC PANEL
ALT: 88 U/L — ABNORMAL HIGH (ref 0–53)
AST: 53 U/L — ABNORMAL HIGH (ref 0–37)
Albumin: 4.7 g/dL (ref 3.5–5.2)
BUN: 9 mg/dL (ref 6–23)
CO2: 27 mEq/L (ref 19–32)
Calcium: 10.1 mg/dL (ref 8.4–10.5)
Chloride: 102 mEq/L (ref 96–112)
Creat: 0.88 mg/dL (ref 0.50–1.35)
Potassium: 4.3 mEq/L (ref 3.5–5.3)

## 2012-12-31 MED ORDER — FLUCONAZOLE 200 MG PO TABS: 400.0000 mg | ORAL_TABLET | Freq: Every day | ORAL | Status: DC

## 2012-12-31 NOTE — Progress Notes (Signed)
[facility] NOTE  RFV: follow up visit for fungal osteo related to worker's comp Subjective:    Patient ID: Keith Nash, male    DOB: [birth year], 27 y.o.   MRN: [id number]  HPI Hyun Marsalis is a 27 y.o. male sustained injury to his 4th and metacarpal of left hand from industrial drill in early December 2013. He reports having fracture to 4th and 5th fingers of left hand, open fracture and likely had water exposure from drilling. He is s/p open reduction with percutaneous pinning as well as subsequent removal of HW on 1/14. He presented as outpatient to Dr. Janee Morn for further management given concern for ongoing infection. He underwent MRI on 2/19 that showed ostemyelitis of 4th metacarpal with large phlegmon. On 07/26/12, he underwent Left hand debridement of phlegmon including 3.75 cm of 4th MC and surrounding soft-tissue with implantation of gentamicin cement spacer and central k-wire. his OR cultures were initially NGTD but was previously on vanco and piptazo prior to surgery but subsequently identified c.albicans on 08/16/12. Path c/w acute osteomyelitis. He was discharged on vancomycin 1500mg  Q 8hr and cipro 750mg  BID. fluconazole 400mg  daily which was added after the fungal culture results returned. He had finished 7 wks of IV vancomycin as of 09/09/12 then switched to bactrim,and ciprofloxacin for a total of 8 wks with plan of fluconazole for 6 months to end on 10/5. His left hand has developed scar tissue above the 4th metacarpal. Little pain. He has been released from  with physical therapy.  Saw orthopedics last month, doing well. He is still has upcoming surgery anticipated to get biopsy in September to see if can still isolate any fungal infection. Once cultures cleared in 4-6 wk then can set up second surgery.   Asking to do a 60 day prescription of fluconazole in order to have all his meds to go back to [place].  Staying there with his family until he comes back to sept 25th when  he sees dr. Janee Morn  Current Outpatient Prescriptions on File Prior to Visit  Medication Sig Dispense Refill  . fluconazole (DIFLUCAN) 200 MG tablet Take 2 tablets (400 mg total) by mouth daily.  60 tablet  5   No current facility-administered medications on file prior to visit.   Active Ambulatory Problems    Diagnosis Date Noted  . 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 No Resolved Ambulatory Problems   Past Medical History  Diagnosis Date  . Medical history non-contributory    History  Substance Use Topics  . Smoking status: Never Smoker   . Smokeless tobacco: Current User    Types: Chew  . Alcohol Use: Yes     Comment: on special occasions    Review of Systems Review of Systems  Constitutional: Negative for fever, chills, diaphoresis, activity change, appetite change, fatigue and unexpected weight change.  HENT: Negative for congestion, sore throat, rhinorrhea, sneezing, trouble swallowing and sinus pressure.  Eyes: Negative for photophobia and visual disturbance.  Respiratory: Negative for cough, chest tightness, shortness of breath, wheezing and stridor.  Cardiovascular: Negative for chest pain, palpitations and leg swelling.  Gastrointestinal: Negative for nausea, vomiting, abdominal pain, diarrhea, constipation, blood in stool, abdominal distention and anal bleeding.  Genitourinary: Negative for dysuria, hematuria, flank pain and difficulty urinating.  Musculoskeletal: Negative for myalgias, back pain, joint swelling, arthralgias and gait problem.  Skin: Negative for color change, pallor, rash and wound.  Neurological: Negative for dizziness,  tremors, weakness and light-headedness.  Hematological: Negative for adenopathy. Does not bruise/bleed easily.  Psychiatric/Behavioral: Negative for behavioral problems, confusion, sleep disturbance, dysphoric mood, decreased concentration and agitation.       Objective:    Physical Exam BP 151/96  Pulse 83  Temp(Src) 97.6 F (36.4 C) (Oral)  Wt 276 lb (125.193 kg)  BMI 36.42 kg/m2 Physical Exam  Constitutional: He is oriented to person, place, and time. He appears well-developed and well-nourished. No distress.  HENT:  Mouth/Throat: Oropharynx is clear and moist. No oropharyngeal exudate.  Cardiovascular: Normal rate, regular rhythm and normal heart sounds. Exam reveals no gallop and no friction rub.  No murmur heard.  Pulmonary/Chest: Effort normal and breath sounds normal. No respiratory distress. He has no wheezes.  Abdominal: Soft. Bowel sounds are normal. He exhibits no distension. There is no tenderness.  Lymphadenopathy:  He has no cervical adenopathy.  Neurological: He is alert and oriented to person, place, and time.  Skin: Skin is warm and dry. No rash noted. No erythema.  Psychiatric: He has a normal mood and affect. His behavior is normal.    Labs: CMP     Component Value Date/Time   NA 138 09/24/2012 0958   K 4.4 09/24/2012 0958   CL 104 09/24/2012 0958   CO2 24 09/24/2012 0958   GLUCOSE 101* 09/24/2012 0958   BUN 14 09/24/2012 0958   CREATININE 1.14 09/24/2012 0958   CREATININE 0.72 07/29/2012 0325   CALCIUM 9.6 09/24/2012 0958   PROT 7.5 09/24/2012 0958   ALBUMIN 4.5 09/24/2012 0958   AST 40* 09/24/2012 0958   ALT 73* 09/24/2012 0958   ALKPHOS 79 09/24/2012 0958   BILITOT 0.4 09/24/2012 0958   GFRNONAA >90 07/29/2012 0325   GFRAA >90 07/29/2012 0325         Assessment & Plan:  Fungal osteomyelitis of hand = continue on fluconazole 400mg  daily until early October  Drug side effect = will check CMP to see if transaminitis assoc with fluc  Care manager : Leane Call [phone]

## 2013-03-03 ENCOUNTER — Ambulatory Visit (INDEPENDENT_AMBULATORY_CARE_PROVIDER_SITE_OTHER): Payer: Worker's Compensation | Admitting: Internal Medicine

## 2013-03-03 ENCOUNTER — Encounter: Payer: Self-pay | Admitting: Internal Medicine

## 2013-03-03 VITALS — BP 150/96 | HR 68 | Temp 98.3°F | Wt 289.0 lb

## 2013-03-03 DIAGNOSIS — M869 Osteomyelitis, unspecified: Secondary | ICD-10-CM

## 2013-03-03 LAB — COMPREHENSIVE METABOLIC PANEL
AST: 54 U/L — ABNORMAL HIGH (ref 0–37)
Alkaline Phosphatase: 88 U/L (ref 39–117)
BUN: 8 mg/dL (ref 6–23)
Glucose, Bld: 102 mg/dL — ABNORMAL HIGH (ref 70–99)
Potassium: 3.9 mEq/L (ref 3.5–5.3)
Sodium: 140 mEq/L (ref 135–145)
Total Bilirubin: 0.5 mg/dL (ref 0.3–1.2)

## 2013-03-03 LAB — C-REACTIVE PROTEIN: CRP: <0.5 mg/dL (ref <0.60)

## 2013-03-03 NOTE — Progress Notes (Signed)
[facility] NOTE  RFV: routine osteomyelitis Subjective:    Patient ID: Keith Nash, male    DOB: November 25, 1985, 27 y.o.   MRN: [id number]  HPI Ranferi Clingan is a 27 y.o. male sustained injury to his 4th and metacarpal of left hand from industrial drill in early December 2013 resulting fracture to 4th and 5th fingers of left hand, open fracture and likely had water exposure from drilling. He is s/p open reduction with percutaneous pinning as well as subsequent removal of HW on 1/14.  He underwent MRI on 2/19 that showed ostemyelitis of 4th metacarpal with large phlegmon. On 07/26/12, he underwent Left hand debridement of phlegmon including 3.75 cm of 4th MC and surrounding soft-tissue with implantation of gentamicin cement spacer and central k-wire. his OR cultures were initially NGTD but was previously on vanco and piptazo prior to surgery but subsequently identified c.albicans on 08/16/12. Path c/w acute osteomyelitis. He was discharged on vancomycin 1500mg  Q 8hr and cipro 750mg  BID. fluconazole 400mg  daily which was added after the fungal culture results returned. He had finished 7 wks of IV vancomycin as of 09/09/12 then switched to bactrim,and ciprofloxacin for a total of 8 wks with plan of fluconazole for 6 months to end on 10/5.   His left hand has developed scar tissue above the 4th metacarpal. Little pain. He saw Dr. Janee Morn and doing well. He is still has upcoming surgery anticipated to get biopsy in September to see if can still isolate any fungal infection. Once cultures cleared in 4-6 wk then can set up second surgery, likely mid-late October.    Lab Results  Component Value Date   ESRSEDRATE 1 09/10/2012   Lab Results  Component Value Date   CRP <0.5 09/10/2012    Review of Systems Weight gain; 12 point ROS is negative    Objective:   Physical Exam BP 150/96  Pulse 68  Temp(Src) 98.3 F (36.8 C) (Oral)  Wt 289 lb (131.09 kg)  BMI 38.14 kg/m2 Physical Exam   Constitutional: He is oriented to person, place, and time. He appears well-developed and well-nourished. No distress.  Skin: Skin is warm and dry. No rash noted. No erythema.  Psychiatric: He has a normal mood and affect. His behavior is normal.  Ext: left hand, 4th finger has better extension. Still has firm tissue mass on dorsum of hand  Labs: Lab Results  Component Value Date   ESRSEDRATE 4 03/03/2013   Lab Results  Component Value Date   CRP <0.5 03/03/2013        Assessment & Plan:   fungal osteomyelitis = to finish 6 months of fluconazole on oct 5th. He will remain off his medication to get biopsy in roughly 10-14 days from day of cessation of fluconazole. If fungal cultures remain negative at 4 wk, likely can proceed to have his repair of hand. Will check crp, sed rate, cmp today.   rtc prn  Cc: thompson

## 2013-03-04 LAB — T-HELPER CELL (CD4) - (RCID CLINIC ONLY): CD4 % Helper T Cell: 37 % (ref 33–55)

## 2013-05-17 NOTE — H&P (Signed)
Keith Nash is an 27 y.o. male.   Chief Complaint: left hand problem HPI: This patient presents for reconstruction of his left hand following treatment for fungal osteomyelitis of the 4th [facility].  Presenting History 07-24-12: This patient is a 27 year old Latino male who presents for evaluation of a left hand problem that began on the date above.  Reportedly the left hand was crushed in a large industrial-type drill.  This occurred while he was working in [place].  He was initially cared for by Dr. Norman Herrlich, of [facility] and underwent open reduction, percutaneous pin fixation of left fourth and fifth metacarpal fractures on 05-14-12.  X-rays from 05-24-12 have become available for my review within the last couple of days indicating transverse fractures through the mid diaphysis of the fifth metacarpal and the proximal diaphysis of the fourth metacarpal with near-anatomic alignment and single pin fixation of each in place.  Subsequent x-rays from 06-12-12 are also available, indicating some early osseous healing of the fifth metacarpal and also some of the fourth.  Records indicate in the patient confirms that on 06-18-12, he returned to the operating room for removal of hardware under MAC anesthesia as both hands have been slightly buried with tissue about them.  The distal pin is noted to be somewhat buried beneath the skin in the operative report.  It is my understanding that the patient has not had any subsequent followup.  He presented initially for an incomplete visit to this office on 07-12-12, but without any of his previous records or x-rays available.  Since those records have become available, he has been rescheduled to his first official new patient appointment tomorrow, but presents today instead noting the onset of some chills and feeling warm since last night.  He continues to have difficulty regaining range of motion in the left hand and it remains swollen, particularly  dorsally.  Past Medical History  Diagnosis Date  . Medical history non-contributory     Past Surgical History  Procedure Laterality Date  . Hand surgery  Dec. 2013, Jan. 2014    left hand  . Amputation Left 07/26/2012    Procedure: PARTIAL EXCISION 4TH METACARPAL/RADICAL EXCISION OF EXTENSOR BURSA/TENOSYNOVIUM;  Surgeon: Jodi Marble, MD;  Location: [facility];  Service: Orthopedics;  Laterality: Left;  PLACEMENT OF ANTIBIOTIC CEMENT SPACER     No family history on file. Social History:  reports that he has never smoked. His smokeless tobacco use includes Chew. He reports that he drinks alcohol. He reports that he does not use illicit drugs.  Allergies: No Known Allergies  No prescriptions prior to admission    No results found for this or any previous visit (from the past 48 hour(s)). No results found.  Review of Systems  All other systems reviewed and are negative.    There were no vitals taken for this visit. Physical Exam  Constitutional:  WD, WN, NAD HEENT:  NCAT, EOMI Neuro/Psych:  Alert & oriented to person, place, and time; appropriate mood & affect Lymphatic: No generalized UE edema or lymphadenopathy Extremities / MSK:  Both UE are normal with respect to appearance, ranges of motion, joint stability, muscle strength/tone, sensation, & perfusion except as otherwise noted:  Incision is healing well.  He continues to have stiffness at the MCP joint and extensor lag of the ring finger.  He can detect a 4.3 1 monofilament on the ring finger over P2, 4.56 distal to the DIP crease, and 3.61 over P1 volarly.  Labs / X-rays:  None today.  Plan:   We reviewed the deficits: 1.  4 cm of absent fourth metacarpal 2.  Discontinuity and gap in the extensor tendon to the ring finger 3.  Stiff MCP joint of the ring finger 4.  Altered sensibility in the ring finger--improving with observation  I discussed with him a plan that includes proceeding with a structural iliac crest  bone grafting to restore the fourth metacarpal, likely addressing the extensor deficit either with side-to-side transfer versus intercalary grafting with a palmaris, which he exhibits today on exam.    Surgery is tentatively planned for 05-19-13.  The details of the operative procedure were discussed with the patient.  Questions were invited and answered.  In addition to the goal of the procedure, the risks of the procedure to include but not limited to bleeding; infection; damage to the nerves or blood vessels that could result in bleeding, numbness, weakness, chronic pain, and the need for additional procedures; stiffness; the need for revision surgery; and anesthetic risks, the worst of which is death, were reviewed.  No specific outcome was guaranteed or implied.  Informed consent was obtained.   An appointment was made to return to see me and hand therapy on 05-26-13.  Melford Tullier A. 05/17/2013, 11:55 AM

## 2013-05-19 ENCOUNTER — Other Ambulatory Visit: Payer: Self-pay | Admitting: Orthopedic Surgery

## 2013-05-19 ENCOUNTER — Encounter (HOSPITAL_COMMUNITY): Payer: Self-pay | Admitting: *Deleted

## 2013-05-19 NOTE — Progress Notes (Signed)
Pt instructions were to arrive at 7:45 AM however, interpreter stated that everyone was told to arrive at 8:15 by MD staff and his schedule is set for then. Pt is currently under the care of workers compensation and Greggory Stallion ( the interpreter)  is assigned to the pt.

## 2013-05-20 ENCOUNTER — Ambulatory Visit (HOSPITAL_COMMUNITY)
Admission: RE | Admit: 2013-05-20 | Discharge: 2013-05-20 | Disposition: A | Discharge status: Home / Self Care | Payer: Worker's Compensation | Source: Ambulatory Visit | Attending: Orthopedic Surgery | Admitting: Orthopedic Surgery

## 2013-05-20 ENCOUNTER — Encounter (HOSPITAL_COMMUNITY): Payer: Self-pay

## 2013-05-20 ENCOUNTER — Encounter (HOSPITAL_COMMUNITY)
Admission: RE | Disposition: A | Discharge status: Home / Self Care | Payer: Self-pay | Source: Ambulatory Visit | Attending: Orthopedic Surgery

## 2013-05-20 ENCOUNTER — Ambulatory Visit (HOSPITAL_COMMUNITY): Payer: Worker's Compensation | Admitting: Anesthesiology

## 2013-05-20 ENCOUNTER — Encounter (HOSPITAL_COMMUNITY): Payer: Worker's Compensation | Admitting: Anesthesiology

## 2013-05-20 ENCOUNTER — Ambulatory Visit (HOSPITAL_COMMUNITY): Payer: Worker's Compensation

## 2013-05-20 DIAGNOSIS — M2569 Stiffness of other specified joint, not elsewhere classified: Secondary | ICD-10-CM | POA: Insufficient documentation

## 2013-05-20 DIAGNOSIS — S68419A Complete traumatic amputation of unspecified hand at wrist level, initial encounter: Secondary | ICD-10-CM | POA: Insufficient documentation

## 2013-05-20 DIAGNOSIS — R209 Unspecified disturbances of skin sensation: Secondary | ICD-10-CM | POA: Insufficient documentation

## 2013-05-20 DIAGNOSIS — F17200 Nicotine dependence, unspecified, uncomplicated: Secondary | ICD-10-CM | POA: Insufficient documentation

## 2013-05-20 DIAGNOSIS — M21939 Unspecified acquired deformity of unspecified forearm: Secondary | ICD-10-CM | POA: Insufficient documentation

## 2013-05-20 DIAGNOSIS — Z01812 Encounter for preprocedural laboratory examination: Secondary | ICD-10-CM | POA: Insufficient documentation

## 2013-05-20 DIAGNOSIS — M959 Acquired deformity of musculoskeletal system, unspecified: Secondary | ICD-10-CM | POA: Insufficient documentation

## 2013-05-20 HISTORY — PX: WRIST FUSION WITH ILIAC CREST BONE GRAFT: SHX5682

## 2013-05-20 HISTORY — DX: Headache: R51

## 2013-05-20 LAB — CBC
Hemoglobin: 16.3 g/dL (ref 13.0–17.0)
MCV: 82.2 fL (ref 78.0–100.0)
Platelets: 203 10*3/uL (ref 150–400)
RBC: 5.66 MIL/uL (ref 4.22–5.81)
RDW: 13.2 % (ref 11.5–15.5)
WBC: 8.5 10*3/uL (ref 4.0–10.5)

## 2013-05-20 LAB — BASIC METABOLIC PANEL
CO2: 22 mEq/L (ref 19–32)
Calcium: 9 mg/dL (ref 8.4–10.5)
Chloride: 102 mEq/L (ref 96–112)
Creatinine, Ser: 0.75 mg/dL (ref 0.50–1.35)
Potassium: 4.1 mEq/L (ref 3.5–5.1)
Sodium: 136 mEq/L (ref 135–145)

## 2013-05-20 SURGERY — WRIST FUSION WITH ILIAC CREST BONE GRAFT
Anesthesia: General | Site: Hand | Laterality: Left

## 2013-05-20 MED ORDER — DEXTROSE 5 % IV SOLN
3.0000 g | INTRAVENOUS | Status: AC
  Administered 2013-05-20: 3 g via INTRAVENOUS
  Filled 2013-05-20: qty 3000

## 2013-05-20 MED ORDER — BUPIVACAINE-EPINEPHRINE (PF) 0.5% -1:200000 IJ SOLN
INTRAMUSCULAR | Status: AC
  Filled 2013-05-20: qty 10

## 2013-05-20 MED ORDER — CEFAZOLIN SODIUM-DEXTROSE 2-3 GM-% IV SOLR: 2.0000 g | INTRAVENOUS | Status: DC

## 2013-05-20 MED ORDER — LACTATED RINGERS IV SOLN
INTRAVENOUS | Status: DC | PRN
  Administered 2013-05-20 (×2): via INTRAVENOUS

## 2013-05-20 MED ORDER — MIDAZOLAM HCL 2 MG/2ML IJ SOLN: 0.5000 mg | Freq: Once | INTRAMUSCULAR | Status: DC | PRN

## 2013-05-20 MED ORDER — OXYCODONE-ACETAMINOPHEN 5-325 MG PO TABS
1.0000 | ORAL_TABLET | ORAL | Status: DC | PRN
  Administered 2013-05-20: 2 via ORAL

## 2013-05-20 MED ORDER — HEMOSTATIC AGENTS (NO CHARGE) OPTIME
TOPICAL | Status: DC | PRN
  Administered 2013-05-20: 1 via TOPICAL

## 2013-05-20 MED ORDER — BUPIVACAINE-EPINEPHRINE 0.5% -1:200000 IJ SOLN
INTRAMUSCULAR | Status: DC | PRN
  Administered 2013-05-20: 20 mL

## 2013-05-20 MED ORDER — HYDROMORPHONE HCL PF 1 MG/ML IJ SOLN
0.2500 mg | INTRAMUSCULAR | Status: DC | PRN
  Administered 2013-05-20 (×3): 0.5 mg via INTRAVENOUS

## 2013-05-20 MED ORDER — PROPOFOL 10 MG/ML IV BOLUS
INTRAVENOUS | Status: DC | PRN
  Administered 2013-05-20 (×2): 10 mg via INTRAVENOUS
  Administered 2013-05-20: 200 mg via INTRAVENOUS

## 2013-05-20 MED ORDER — PROMETHAZINE HCL 25 MG/ML IJ SOLN: 6.2500 mg | INTRAMUSCULAR | Status: DC | PRN

## 2013-05-20 MED ORDER — OXYCODONE-ACETAMINOPHEN 5-325 MG PO TABS
ORAL_TABLET | ORAL | Status: AC
  Administered 2013-05-20: 2 via ORAL
  Filled 2013-05-20: qty 2

## 2013-05-20 MED ORDER — MEPERIDINE HCL 25 MG/ML IJ SOLN: 6.2500 mg | INTRAMUSCULAR | Status: DC | PRN

## 2013-05-20 MED ORDER — HYDROMORPHONE HCL PF 1 MG/ML IJ SOLN
INTRAMUSCULAR | Status: AC
  Administered 2013-05-20: 0.5 mg via INTRAVENOUS
  Filled 2013-05-20: qty 1

## 2013-05-20 MED ORDER — FENTANYL CITRATE 0.05 MG/ML IJ SOLN
INTRAMUSCULAR | Status: DC | PRN
  Administered 2013-05-20 (×3): 50 ug via INTRAVENOUS
  Administered 2013-05-20: 100 ug via INTRAVENOUS
  Administered 2013-05-20 (×2): 50 ug via INTRAVENOUS

## 2013-05-20 MED ORDER — LIDOCAINE HCL (CARDIAC) 20 MG/ML IV SOLN
INTRAVENOUS | Status: DC | PRN
  Administered 2013-05-20: 40 mg via INTRAVENOUS

## 2013-05-20 MED ORDER — CHLORHEXIDINE GLUCONATE 4 % EX LIQD: 60.0000 mL | Freq: Once | CUTANEOUS | Status: DC

## 2013-05-20 MED ORDER — ARTIFICIAL TEARS OP OINT
TOPICAL_OINTMENT | OPHTHALMIC | Status: DC | PRN
  Administered 2013-05-20: 1 via OPHTHALMIC

## 2013-05-20 MED ORDER — MIDAZOLAM HCL 5 MG/5ML IJ SOLN
INTRAMUSCULAR | Status: DC | PRN
  Administered 2013-05-20 (×4): 1 mg via INTRAVENOUS

## 2013-05-20 MED ORDER — ONDANSETRON HCL 4 MG/2ML IJ SOLN
INTRAMUSCULAR | Status: DC | PRN
  Administered 2013-05-20: 4 mg via INTRAVENOUS

## 2013-05-20 MED ORDER — HYDROMORPHONE HCL PF 1 MG/ML IJ SOLN: 0.5000 mg | INTRAMUSCULAR | Status: DC | PRN

## 2013-05-20 MED ORDER — DEXTROSE 5 % IV SOLN
INTRAVENOUS | Status: DC | PRN
  Administered 2013-05-20: 12:00:00 via INTRAVENOUS

## 2013-05-20 MED ORDER — 0.9 % SODIUM CHLORIDE (POUR BTL) OPTIME
TOPICAL | Status: DC | PRN
  Administered 2013-05-20: 1000 mL

## 2013-05-20 MED ORDER — LACTATED RINGERS IV SOLN
INTRAVENOUS | Status: DC
  Administered 2013-05-20: 10:00:00 via INTRAVENOUS

## 2013-05-20 SURGICAL SUPPLY — 83 items
BANDAGE ELASTIC 4 VELCRO ST LF (GAUZE/BANDAGES/DRESSINGS) IMPLANT
BANDAGE GAUZE ELAST BULKY 4 IN (GAUZE/BANDAGES/DRESSINGS) ×2 IMPLANT
BLADE AVERAGE 25X9 (BLADE) ×2 IMPLANT
BLADE LONG MED 31X9 (MISCELLANEOUS) ×2 IMPLANT
BNDG COHESIVE 4X5 TAN NS LF (GAUZE/BANDAGES/DRESSINGS) ×2 IMPLANT
BNDG COHESIVE 4X5 TAN STRL (GAUZE/BANDAGES/DRESSINGS) ×4 IMPLANT
BNDG ESMARK 4X9 LF (GAUZE/BANDAGES/DRESSINGS) IMPLANT
BNDG GAUZE ELAST 4 BULKY (GAUZE/BANDAGES/DRESSINGS) ×4 IMPLANT
CANISTER SUCTION 1200CC (MISCELLANEOUS) ×2 IMPLANT
CHLORAPREP W/TINT 26ML (MISCELLANEOUS) ×2 IMPLANT
CLEANER TIP ELECTROSURG 2X2 (MISCELLANEOUS) ×2 IMPLANT
COVER MAYO STAND STRL (DRAPES) ×2 IMPLANT
COVER SURGICAL LIGHT HANDLE (MISCELLANEOUS) ×2 IMPLANT
DECANTER SPIKE VIAL GLASS SM (MISCELLANEOUS) ×2 IMPLANT
DRAPE C-ARM 42X72 X-RAY (DRAPES) ×2 IMPLANT
DRAPE INCISE IOBAN 66X45 STRL (DRAPES) ×2 IMPLANT
DRAPE OEC MINIVIEW 54X84 (DRAPES) IMPLANT
DRAPE SURG 17X23 STRL (DRAPES) IMPLANT
DRSG ADAPTIC 3X8 NADH LF (GAUZE/BANDAGES/DRESSINGS) ×2 IMPLANT
DRSG EMULSION OIL 3X3 NADH (GAUZE/BANDAGES/DRESSINGS) IMPLANT
DRSG OPSITE 6X11 MED (GAUZE/BANDAGES/DRESSINGS) ×2 IMPLANT
DRSG TEGADERM 4X4.75 (GAUZE/BANDAGES/DRESSINGS) ×4 IMPLANT
ELECT REM PT RETURN 9FT ADLT (ELECTROSURGICAL) ×2
ELECTRODE REM PT RTRN 9FT ADLT (ELECTROSURGICAL) ×1 IMPLANT
GLOVE BIO SURGEON STRL SZ7.5 (GLOVE) ×8 IMPLANT
GLOVE BIOGEL PI IND STRL 7.0 (GLOVE) ×1 IMPLANT
GLOVE BIOGEL PI IND STRL 7.5 (GLOVE) ×1 IMPLANT
GLOVE BIOGEL PI IND STRL 8 (GLOVE) ×2 IMPLANT
GLOVE BIOGEL PI INDICATOR 7.0 (GLOVE) ×1
GLOVE BIOGEL PI INDICATOR 7.5 (GLOVE) ×1
GLOVE BIOGEL PI INDICATOR 8 (GLOVE) ×2
GLOVE SURG SS PI 7.0 STRL IVOR (GLOVE) ×2 IMPLANT
GOWN PREVENTION PLUS LG XLONG (DISPOSABLE) ×4 IMPLANT
GOWN STRL NON-REIN LRG LVL3 (GOWN DISPOSABLE) ×4 IMPLANT
KIT BASIN OR (CUSTOM PROCEDURE TRAY) ×2 IMPLANT
KWIRE 4.0 X .062IN (WIRE) ×4 IMPLANT
NEEDLE 1/2 CIR CATGUT .05X1.09 (NEEDLE) IMPLANT
NEEDLE HYPO 25X1 1.5 SAFETY (NEEDLE) IMPLANT
NS IRRIG 1000ML POUR BTL (IV SOLUTION) ×2 IMPLANT
PACK ORTHO EXTREMITY (CUSTOM PROCEDURE TRAY) ×2 IMPLANT
PAD ABD 8X10 STRL (GAUZE/BANDAGES/DRESSINGS) IMPLANT
PAD CAST 3X4 CTTN HI CHSV (CAST SUPPLIES) IMPLANT
PAD CAST 4YDX4 CTTN HI CHSV (CAST SUPPLIES) ×1 IMPLANT
PADDING CAST ABS 4INX4YD NS (CAST SUPPLIES) ×2
PADDING CAST ABS COTTON 4X4 ST (CAST SUPPLIES) ×2 IMPLANT
PADDING CAST COTTON 3X4 STRL (CAST SUPPLIES)
PADDING CAST COTTON 4X4 STRL (CAST SUPPLIES) ×1
PLATE BONE 4HOLE 25MM LOCK (Plate) ×2 IMPLANT
SCREW PEG 2.5X16 NONLOCK (Screw) ×4 IMPLANT
SCREW PEG 2.5X18 NONLOCK (Screw) ×2 IMPLANT
SCREW PEG LOCK 2.5X12 (Screw) ×2 IMPLANT
SCREW PEG LOCK 2.5X15 (Orthopedic Implant) ×6 IMPLANT
SCREW PEG LOCK 2.5X16 (Peg) ×2 IMPLANT
SCREW PEG LOCK 2.5X24 (Peg) ×2 IMPLANT
SCREW PEG LOCK 2.5X8 (Screw) ×2 IMPLANT
SCREWDRIVER BIT 2.0/2.5 127MM (BIT) ×2 IMPLANT
SLING ARM FOAM STRAP XLG (SOFTGOODS) ×2 IMPLANT
SPLINT FIBERGLASS 3X12 (CAST SUPPLIES) ×2 IMPLANT
SPONGE GAUZE 4X4 12PLY (GAUZE/BANDAGES/DRESSINGS) ×2 IMPLANT
SPONGE LAP 18X18 X RAY DECT (DISPOSABLE) ×4 IMPLANT
SPONGE SURGIFOAM ABS GEL 100 (HEMOSTASIS) ×2 IMPLANT
STAPLER VISISTAT 35W (STAPLE) ×2 IMPLANT
STRIP CLOSURE SKIN 1/4X4 (GAUZE/BANDAGES/DRESSINGS) IMPLANT
SUT ETHIBOND 2 0 SH (SUTURE) ×2 IMPLANT
SUT ETHILON 2 0 FS 18 (SUTURE) ×2 IMPLANT
SUT FIBERWIRE #2 38 T-5 BLUE (SUTURE)
SUT VIC AB 0 CT1 27 (SUTURE)
SUT VIC AB 0 CT1 27XBRD ANBCTR (SUTURE) IMPLANT
SUT VIC AB 2-0 CT1 27 (SUTURE) ×2
SUT VIC AB 2-0 CT1 TAPERPNT 27 (SUTURE) ×2 IMPLANT
SUT VIC AB 2-0 SH 27 (SUTURE) ×1
SUT VIC AB 2-0 SH 27X BRD (SUTURE) ×1 IMPLANT
SUT VIC AB 2-0 SH 27XBRD (SUTURE) IMPLANT
SUT VIC AB 2-0 UR6 27 (SUTURE) IMPLANT
SUT VICRYL RAPIDE 4/0 PS 2 (SUTURE) ×4 IMPLANT
SUTURE FIBERWR #2 38 T-5 BLUE (SUTURE) IMPLANT
SYR BULB 3OZ (MISCELLANEOUS) ×2 IMPLANT
SYRINGE 10CC LL (SYRINGE) IMPLANT
TOWEL OR 17X24 6PK STRL BLUE (TOWEL DISPOSABLE) ×4 IMPLANT
TUBE CONNECTING 12X1/4 (SUCTIONS) ×2 IMPLANT
UNDERPAD 30X30 INCONTINENT (UNDERPADS AND DIAPERS) ×2 IMPLANT
WATER STERILE IRR 1000ML POUR (IV SOLUTION) ×2 IMPLANT
YANKAUER SUCT BULB TIP NO VENT (SUCTIONS) ×2 IMPLANT

## 2013-05-20 NOTE — Anesthesia Postprocedure Evaluation (Signed)
  Anesthesia Post-op Note  Patient: Keith Nash  Procedure(s) Performed: Procedure(s): LEFT HAND BONE/TENDON RECONSTRUCTION WITH ILIAC CREST BONE GRAFT  (Left)  Patient Location: PACU  Anesthesia Type:General  Level of Consciousness: awake, alert , oriented and patient cooperative  Airway and Oxygen Therapy: Patient Spontanous Breathing  Post-op Pain: mild  Post-op Assessment: Post-op Vital signs reviewed, Patient's Cardiovascular Status Stable, Respiratory Function Stable, Patent Airway, No signs of Nausea or vomiting and Pain level controlled  Post-op Vital Signs: Reviewed and stable  Complications: No apparent anesthesia complications

## 2013-05-20 NOTE — Anesthesia Procedure Notes (Signed)
Procedure Name: LMA Insertion Date/Time: 05/20/2013 11:32 AM Performed by: Darcey Nora B Pre-anesthesia Checklist: Patient identified, Emergency Drugs available, Suction available and Patient being monitored Patient Re-evaluated:Patient Re-evaluated prior to inductionOxygen Delivery Method: Circle system utilized Preoxygenation: Pre-oxygenation with 100% oxygen Intubation Type: IV induction Ventilation: Mask ventilation without difficulty LMA: LMA inserted LMA Size: 5.0 Number of attempts: 1 Placement Confirmation: breath sounds checked- equal and bilateral and positive ETCO2 Tube secured with: taped across cheeks; gauze roll b/t teeth. Dental Injury: Teeth and Oropharynx as per pre-operative assessment

## 2013-05-20 NOTE — Anesthesia Preprocedure Evaluation (Addendum)
Anesthesia Evaluation  Patient identified by MRN, date of birth, ID band Patient awake    Reviewed: Allergy & Precautions, H&P , Patient's Chart, lab work & pertinent test results, reviewed documented beta blocker date and time   History of Anesthesia Complications Negative for: history of anesthetic complications  Airway Mallampati: II TM Distance: >3 FB Neck ROM: full    Dental  (+) Teeth Intact and Dental Advisory Given   Pulmonary  breath sounds clear to auscultation        Cardiovascular Exercise Tolerance: Good Rhythm:regular Rate:Normal     Neuro/Psych  Headaches, negative psych ROS   GI/Hepatic   Endo/Other    Renal/GU      Musculoskeletal   Abdominal   Peds  Hematology   Anesthesia Other Findings   Reproductive/Obstetrics                         Anesthesia Physical Anesthesia Plan  ASA: II  Anesthesia Plan: General LMA and General   Post-op Pain Management:    Induction: Intravenous  Airway Management Planned: LMA  Additional Equipment:   Intra-op Plan:   Post-operative Plan:   Informed Consent: I have reviewed the patients History and Physical, chart, labs and discussed the procedure including the risks, benefits and alternatives for the proposed anesthesia with the patient or authorized representative who has indicated his/her understanding and acceptance.   Dental Advisory Given  Plan Discussed with: CRNA, Surgeon and Anesthesiologist  Anesthesia Plan Comments:       Anesthesia Quick Evaluation

## 2013-05-20 NOTE — Op Note (Signed)
05/20/2013  11:11 AM  PATIENT:  Keith Nash  27 y.o. male  PRE-OPERATIVE DIAGNOSIS:  Complex composite defect of left hand, including extensor tendon and fourth metacarpal  POST-OPERATIVE DIAGNOSIS:  Same  PROCEDURE:   1.  Left hand iliac crest structural bone graft harvest    2  .Left hand removal of antibiotic spacer with debridement of wound bed and bone    3.  Internal fixation of an intercalary structural graft in fourth metacarpal    4.  Left fourth MCP capsulotomy and manipulation    5.  Left ring finger extensor tendon reconstruction in the hand and wrist with free tendon graft (palmaris longus), including graft harvest and tenolysis of the stumps both proximally and distally     SURGEON: Cliffton Asters. Janee Morn, MD  PHYSICIAN ASSISTANT: None  ANESTHESIA:  general  SPECIMENS:  None  DRAINS:   None  PREOPERATIVE INDICATIONS:  Keith Nash is a  27 y.o. 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  OPERATIVE IMPLANTS: Biomet hand fracture F3 spider plate and 1.610 inch K wire  OPERATIVE PROCEDURE:  After receiving prophylactic antibiotics, the patient was escorted to the operative theatre and placed in a supine position.  General anesthesia was administered.  A surgical "time-out" was performed during which the planned procedure, proposed operative site, and the correct patient identity were compared to the operative consent and agreement confirmed by the circulating nurse according to current facility policy.  Following application of a tourniquet to the  operative extremity, the exposed skin overlying the left anterior iliac crest as well as the left upper extremity was prepped with Chloraprep and draped in the usual sterile fashion.  The limb was exsanguinated with an Esmarch bandage and the tourniquet inflated to approximately higher than systolic BP.  Attention was first directed to the left upper extremity where the previous incision was made and extended somewhat proximal and distal.  A full-thickness flap was elevated.  The extensor tendon to the long finger was identified and had no adhesions.  The same was true for the small finger.  The deficiency in the ring finger tendon was identified and both the proximal and distal stumps underwent tenolysis.  The antibiotic spacer was removed.  The fibrous rind around it was debrided through scraping dissection, as was the bone at the facing surfaces of the proximal and distal ends of the fourth metacarpal.  The deficiency was measured to be just over 4 cm.  Attention was directed to harvesting the bone graft through an incision overlying the left anterior iliac crest, with care to pull the skin and soft tissues up over the crest before making the incision to allow them ultimately to retract below the prominence of the crest.  In addition, the graft was obtained far enough posteriorly that it was well away from the anterior superior iliac spine.  Graft was essentially a bicortical piece with the superior and anterior cortex preserved, Measuring 4/2 cm in length..  The graft was harvested with a saw, copiously irrigating it for the end cuts and then osteotome for the remainder the harvest.  The wound was irrigated, Gelfoam placed, and a lap sponge  placed into the depths.  The graft was placed on the back table.  Attention was directed back to the hand.  The graft was further shaped and then inset.  There was much more distal fourth metacarpal and it was proximal fourth metacarpal.  This allowed for  fixation between the graft and the distal fourth metacarpal by placement of a dorsal spider plate.  There were 3.6 fixation at least on the distal metacarpal.  The plate stretched over the remainder of the graft and proximally the plate was cut so that it did not extend dorsally over the carpus.  At the proximal end of the graft, it was held palmarly enough by running a 0.062 inch K wire across the  metacarpal bases, securing the fifth, the 4tht, and the third.  In addition some proximal screws were placed trying to secure the graft to the small piece of proximal metacarpal.  It appeared as if all the fixation remained extra-articular.  The graft seemed well secured and the rotation of the digit was such that the reaming had a touchdown point between the long and small.  A dorsal capsulotomy was made to include release of both the radial and ulnar collaterals, at least their superior most portions and full flexion of the MP joint could be obtained.  The ring finger MCP joint was also manipulated following the appropriate soft tissue releases.  Distal metacarpal seems solid and stable with MP motion at this point.  A transverse incision was made at the base of the palm for harvest of the palmaris longus tendon, which was used as an intercalary graft across the defect in the extensor tendon to the ring finger, with a Pulvertaft weave both proximally and distally secured with 2-0 braided polyester suture.There were 3 passes of graft through each stump.Attention was paid to attempting to create the appropriate tension in the extensor system such that with passive wrist flexion and the MP joint extended and with passive extension and flexed appropriately with the others, perhaps inset just a little tight at this time.    The tourniquet was released, and additional hemostasis obtained at both sites, and the skin edges were infiltrated with half percent Marcaine with epinephrine.  At the hand, the skin was closed with  4-0 Vicryl Rapide interrupted and running horizontal mattress suture.  At the bone graft harvest site, the Gelfoam remnants were removed, the wound was irrigated and the Gelfoam replaced.  The fascia over the crest was closed with 2-0 Vicryl suture followed by 2-0 Vicryl reapproximation of the deep dermis and staples at the skin.  Gauze and an OpSite were placed at the iliac crest.  A bulky hand dressing with a volar plaster component was applied to the upper extremity, not allowing full flexion of the MP joints.  He was awakened and taken to the recovery room in stable condition, breathing spontaneously  DISPOSITION: He will be discharged home today with typical instructions returning next Monday to have x-rays of the left hand obtained and transition to therapy for a custom splint that his forearm-based and involves a dynamic outrigger for the extensor tendon reconstruction, allowing active flexion to 90, and passive full extension to the splint.

## 2013-05-20 NOTE — Preoperative (Signed)
Beta Blockers   Reason not to administer Beta Blockers:Not Applicable 

## 2013-05-20 NOTE — Progress Notes (Signed)
Report given to Lestine Mount as caregiver

## 2013-05-20 NOTE — Interval H&P Note (Signed)
History and Physical Interval Note:  05/20/2013 10:49 AM  Keith Nash  has presented today for surgery, with the diagnosis of LEFT FOURTH MC DEFECT  The various methods of treatment have been discussed with the patient and family. After consideration of risks, benefits and other options for treatment, the patient has consented to  Procedure(s): LEFT HAND BONE/TENDON RECONSTRUCTION WITH ILIAC CREST BONE GRAFT  (Left) as a surgical intervention .  The patient's history has been reviewed, patient examined, no change in status, stable for surgery.  I have reviewed the patient's chart and labs.  Questions were answered to the patient's satisfaction.     [name] A.

## 2013-05-20 NOTE — Transfer of Care (Signed)
Immediate Anesthesia Transfer of Care Note  Patient: Keith Nash  Procedure(s) Performed: Procedure(s): LEFT HAND BONE/TENDON RECONSTRUCTION WITH ILIAC CREST BONE GRAFT  (Left)  Patient Location: PACU  Anesthesia Type:General  Level of Consciousness: awake, alert  and patient cooperative  Airway & Oxygen Therapy: Patient Spontanous Breathing and Patient connected to nasal cannula oxygen  Post-op Assessment: Report given to PACU RN, Post -op Vital signs reviewed and stable and Patient moving all extremities  Post vital signs: Reviewed and stable  Complications: No apparent anesthesia complications

## 2013-05-22 ENCOUNTER — Encounter (HOSPITAL_COMMUNITY): Payer: Self-pay | Admitting: Orthopedic Surgery

## 2013-05-23 ENCOUNTER — Encounter (HOSPITAL_COMMUNITY): Payer: Self-pay | Admitting: Emergency Medicine

## 2013-05-23 DIAGNOSIS — IMO0002 Reserved for concepts with insufficient information to code with codable children: Secondary | ICD-10-CM | POA: Insufficient documentation

## 2013-05-23 DIAGNOSIS — Z79899 Other long term (current) drug therapy: Secondary | ICD-10-CM | POA: Insufficient documentation

## 2013-05-23 DIAGNOSIS — Y838 Other surgical procedures as the cause of abnormal reaction of the patient, or of later complication, without mention of misadventure at the time of the procedure: Secondary | ICD-10-CM | POA: Insufficient documentation

## 2013-05-23 DIAGNOSIS — L02219 Cutaneous abscess of trunk, unspecified: Secondary | ICD-10-CM | POA: Insufficient documentation

## 2013-05-23 NOTE — ED Notes (Signed)
Pt. reports fever with chills and pain at left lateral / lower abdomen and left hand unrelieved by prescription pain medications . Pt. also reported drainage at incision site at left lower abdomen and constipation .

## 2013-05-24 ENCOUNTER — Emergency Department (HOSPITAL_COMMUNITY)
Admission: EM | Admit: 2013-05-24 | Discharge: 2013-05-24 | Disposition: A | Discharge status: Home / Self Care | Payer: Worker's Compensation | Attending: Emergency Medicine | Admitting: Emergency Medicine

## 2013-05-24 ENCOUNTER — Emergency Department (HOSPITAL_COMMUNITY): Payer: Worker's Compensation

## 2013-05-24 DIAGNOSIS — T148XXA Other injury of unspecified body region, initial encounter: Secondary | ICD-10-CM

## 2013-05-24 DIAGNOSIS — L0390 Cellulitis, unspecified: Secondary | ICD-10-CM

## 2013-05-24 LAB — CBC WITH DIFFERENTIAL/PLATELET
Basophils Absolute: 0 10*3/uL (ref 0.0–0.1)
Basophils Relative: 0 % (ref 0–1)
Eosinophils Absolute: 0.1 10*3/uL (ref 0.0–0.7)
Eosinophils Relative: 1 % (ref 0–5)
HCT: 39.7 % (ref 39.0–52.0)
Hemoglobin: 13.8 g/dL (ref 13.0–17.0)
MCH: 28.8 pg (ref 26.0–34.0)
MCHC: 34.8 g/dL (ref 30.0–36.0)
MCV: 82.7 fL (ref 78.0–100.0)
Monocytes Absolute: 0.9 10*3/uL (ref 0.1–1.0)
Monocytes Relative: 10 % (ref 3–12)
Neutrophils Relative %: 50 % (ref 43–77)
RDW: 12.7 % (ref 11.5–15.5)

## 2013-05-24 LAB — COMPREHENSIVE METABOLIC PANEL
Albumin: 3.5 g/dL (ref 3.5–5.2)
Alkaline Phosphatase: 103 U/L (ref 39–117)
BUN: 5 mg/dL — ABNORMAL LOW (ref 6–23)
CO2: 29 mEq/L (ref 19–32)
Calcium: 8.9 mg/dL (ref 8.4–10.5)
Chloride: 96 mEq/L (ref 96–112)
Creatinine, Ser: 0.84 mg/dL (ref 0.50–1.35)
GFR calc Af Amer: >90 mL/min (ref >90)
Potassium: 4.2 mEq/L (ref 3.5–5.1)
Total Bilirubin: 0.9 mg/dL (ref 0.3–1.2)

## 2013-05-24 MED ORDER — CLINDAMYCIN PHOSPHATE 600 MG/50ML IV SOLN
600.0000 mg | Freq: Once | INTRAVENOUS | Status: AC
  Administered 2013-05-24: 600 mg via INTRAVENOUS
  Filled 2013-05-24: qty 50

## 2013-05-24 MED ORDER — SODIUM CHLORIDE 0.9 % IV BOLUS (SEPSIS)
250.0000 mL | Freq: Once | INTRAVENOUS | Status: AC
  Administered 2013-05-24: 250 mL via INTRAVENOUS

## 2013-05-24 MED ORDER — CLINDAMYCIN HCL 300 MG PO CAPS: 300.0000 mg | ORAL_CAPSULE | Freq: Four times a day (QID) | ORAL | Status: DC

## 2013-05-24 MED ORDER — HYDROMORPHONE HCL PF 1 MG/ML IJ SOLN
1.0000 mg | Freq: Once | INTRAMUSCULAR | Status: AC
  Administered 2013-05-24: 1 mg via INTRAVENOUS
  Filled 2013-05-24: qty 1

## 2013-05-24 MED ORDER — DOCUSATE SODIUM 100 MG PO CAPS: 100.0000 mg | ORAL_CAPSULE | Freq: Two times a day (BID) | ORAL | Status: DC

## 2013-05-24 MED ORDER — POLYETHYLENE GLYCOL 3350 17 G PO PACK: 17.0000 g | PACK | Freq: Every day | ORAL | Status: DC

## 2013-05-24 NOTE — ED Notes (Signed)
Patient in radiology

## 2013-05-24 NOTE — ED Notes (Signed)
Dressing covered.  Patient ambulatory with no apparent difficulty on discharge

## 2013-05-24 NOTE — ED Provider Notes (Signed)
CSN: [id number]     Arrival date & time 05/23/13  2338 History   First MD Initiated Contact with Patient 05/24/13 0200     Chief Complaint  Patient presents with  . Fever   (Consider location/radiation/quality/duration/timing/severity/associated sxs/prior Treatment) HPI This patient is a 27 year old man who is postop day #4 status post iliac bone graft to the left hand with tendon reconstruction by Dr. Mack Hook.  He presents with complaints of subjective fever and chills as well as. Incisional pain, erythema, increased warmth to touch. Sx began yesterday and have been persistent. The patient has been taking Dilaudid and Percocet, as prescribed, but says that he is not getting much pain relief. He has not checked his temp with a thermometer at home.   He is also concerned because he has not had a BM since the night before surgery.   Pain is aching, constant, worse with movements of the torso. No drainage from incision site.   Past Medical History  Diagnosis Date  . Medical history non-contributory   . [id number](478.2)    Past Surgical History  Procedure Laterality Date  . Hand surgery  Dec. 2013, Jan. 2014    left hand  . 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 Wrist fusion with iliac crest bone graft Left 05/20/2013    Procedure: LEFT HAND BONE/TENDON RECONSTRUCTION WITH ILIAC CREST BONE GRAFT ;  Surgeon: Jodi Marble, MD;  Location: [facility];  Service: Orthopedics;  Laterality: Left;   Family History  Problem Relation Age of Onset  . Heart disease Mother   . Cataracts Mother    History  Substance Use Topics  . Smoking status: Never Smoker   . Smokeless tobacco: Current User    Types: Chew  . Alcohol Use: Yes     Comment: on special occasions    Review  of Systems 10 point review of systems obtained and is negative with the exception of symptoms noted above.  Allergies  Fruit & vegetable daily  Home Medications   Current Outpatient Rx  Name  Route  Sig  Dispense  Refill  . HYDROmorphone (DILAUDID) 2 MG tablet   Oral   Take 2 mg by mouth every 4 (four) hours as needed for severe pain.         Marland Kitchen oxyCODONE-acetaminophen (PERCOCET/ROXICET) 5-325 MG per tablet   Oral   Take 1 tablet by mouth every 4 (four) hours as needed for severe pain.         . clindamycin (CLEOCIN) 300 MG capsule   Oral   Take 1 capsule (300 mg total) by mouth 4 (four) times daily. X 7 days   28 capsule   0   . docusate sodium (COLACE) 100 MG capsule   Oral   Take 1 capsule (100 mg total) by mouth every 12 (twelve) hours.   60 capsule   0   . polyethylene glycol (MIRALAX / GLYCOLAX) packet   Oral   Take 17 g by mouth daily.   14 each   0    BP 130/84  Pulse 76  Temp(Src) 98.7 F (37.1 C) (Oral)  Resp 20  SpO2 96% Physical Exam Gen: well developed and well nourished appearing Head: NCAT Eyes: PERL, EOMI Nose: no epistaixis or rhinorrhea Mouth/throat: mucosa is moist and pink  Neck: supple, no stridor Lungs: CTA B, no wheezing, rhonchi or rales CV: RRR, no murmur, good extremity pulses  Abd: obese, incision over the left flank is C/D/I - there is STS in this region with well demarcated erythema of the surrounding skin which is mildly indurated and has increased warmth to touch compared to other regions. TTP.  Back: no midline ttp Skin: warm and dry, no lesions Neuro: CN ii-xii grossly intact, no focal deficits, normal speech, normal gait.  Psyche; normal affect,  calm and cooperative.  ED Course  Procedures (including critical care time) Labs Review Results for orders placed during the hospital encounter of 05/24/13 (from the past 24 hour(s))  CBC WITH DIFFERENTIAL     Status: None   Collection Time    05/23/13 11:50 PM      Result  Value Range   WBC 8.7  4.0 - 10.5 K/uL   RBC 4.80  4.22 - 5.81 MIL/uL   Hemoglobin 13.8  13.0 - 17.0 g/dL   HCT 16.1  09.6 - 04.5 %   MCV 82.7  78.0 - 100.0 fL   MCH 28.8  26.0 - 34.0 pg   MCHC 34.8  30.0 - 36.0 g/dL   RDW 40.9  81.1 - 91.4 %   Platelets 227  150 - 400 K/uL   Neutrophils Relative % 50  43 - 77 %   Neutro Abs 4.3  1.7 - 7.7 K/uL   Lymphocytes Relative 39  12 - 46 %   Lymphs Abs 3.4  0.7 - 4.0 K/uL   Monocytes Relative 10  3 - 12 %   Monocytes Absolute 0.9  0.1 - 1.0 K/uL   Eosinophils Relative 1  0 - 5 %   Eosinophils Absolute 0.1  0.0 - 0.7 K/uL   Basophils Relative 0  0 - 1 %   Basophils Absolute 0.0  0.0 - 0.1 K/uL  COMPREHENSIVE METABOLIC PANEL     Status: Abnormal   Collection Time    05/23/13 11:50 PM      Result Value Range   Sodium 134 (*) 135 - 145 mEq/L   Potassium 4.2  3.5 - 5.1 mEq/L   Chloride 96  96 - 112 mEq/L   CO2 29  19 - 32 mEq/L   Glucose, Bld 119 (*) 70 - 99 mg/dL   BUN 5 (*) 6 - 23 mg/dL   Creatinine, Ser 7.82  0.50 - 1.35 mg/dL   Calcium 8.9  8.4 - 95.6 mg/dL   Total Protein 7.6  6.0 - 8.3 g/dL   Albumin 3.5  3.5 - 5.2 g/dL   AST 213 (*) 0 - 37 U/L   ALT 147 (*) 0 - 53 U/L   Alkaline Phosphatase 103  39 - 117 U/L   Total Bilirubin 0.9  0.3 - 1.2 mg/dL   GFR calc non Af Amer >90  >90 mL/min   GFR calc Af Amer >90  >90 mL/min   Imaging Review Dg Abd 1 View  05/24/2013   CLINICAL DATA:  Fever. History of recent bone graft from pelvis to hand.  EXAM: ABDOMEN - 1 VIEW  COMPARISON:  None.  FINDINGS: The bowel gas pattern is nonobstructed. There is a bony defect at the left anterior superior iliac spine, with unremarkable margins given recent harvesting. No abnormal intra-abdominal mass effect or calcification.  IMPRESSION: Unremarkable left iliac bone harvest site.   Electronically Signed   By: Tiburcio Pea M.D.   On: 05/24/2013 03:18  US Abdomen Limited  05/24/2013   CLINICAL DATA:  Evaluate left flank. Status post harvest dating  bone graft 05/21/2013, with concern for hematoma or abscess.  EXAM: US ABDOMEN LIMITED - left flank  COMPARISON:  None.  FINDINGS: Grayscale and color Doppler sonographic imaging of the region of interest (left flank) shows a ovoid 10 x 2 x 2 cm predominantly hypoechoic collection with thin nonvascular septations. The collection follows the incision, 2 cm below the skin surface.  IMPRESSION: 10 x 2 x 2 cm subincisional collection is most consistent with seroma/non-acute hematoma. Sterility is indeterminate by imaging.   Electronically Signed   By: Tiburcio Pea M.D.   On: 05/24/2013 05:45    EKG Interpretation   None       MDM  Patient with post operative hematoma of the left flank following iliac bone graft harvest. It appears that this is complicated by cellulitis. However, the patient is afebrile in the ED with normal WBC. We have treated with Clindamycin in the ED and I have consulted with Dr. Janee Morn.   We concur that the patient is stable for discharge with plan for office follow up on Monday, as scheduled. Will prescribe Miralex and Docusate for constipation secondary to opiates. Patient counseled to return to the ED for worsening sx/fever.  1. Hematoma   2. Cellulitis        Brandt Loosen, MD 05/24/13 [phone]

## 2013-05-30 LAB — CULTURE, BLOOD (ROUTINE X 2): Culture: NO GROWTH

## 2013-07-27 ENCOUNTER — Encounter (HOSPITAL_COMMUNITY): Payer: Self-pay | Admitting: Emergency Medicine

## 2013-07-27 ENCOUNTER — Emergency Department (HOSPITAL_COMMUNITY): Payer: Worker's Compensation

## 2013-07-27 ENCOUNTER — Observation Stay (HOSPITAL_COMMUNITY)
Admission: EM | Admit: 2013-07-27 | Discharge: 2013-07-28 | Disposition: A | Discharge status: Home / Self Care | Payer: Worker's Compensation | Attending: Family Medicine | Admitting: Family Medicine

## 2013-07-27 DIAGNOSIS — R51 Headache: Secondary | ICD-10-CM | POA: Insufficient documentation

## 2013-07-27 DIAGNOSIS — R200 Anesthesia of skin: Secondary | ICD-10-CM

## 2013-07-27 DIAGNOSIS — R202 Paresthesia of skin: Secondary | ICD-10-CM

## 2013-07-27 DIAGNOSIS — R209 Unspecified disturbances of skin sensation: Secondary | ICD-10-CM | POA: Insufficient documentation

## 2013-07-27 DIAGNOSIS — Z79899 Other long term (current) drug therapy: Secondary | ICD-10-CM | POA: Insufficient documentation

## 2013-07-27 DIAGNOSIS — F419 Anxiety disorder, unspecified: Secondary | ICD-10-CM

## 2013-07-27 DIAGNOSIS — M6281 Muscle weakness (generalized): Secondary | ICD-10-CM | POA: Insufficient documentation

## 2013-07-27 DIAGNOSIS — M869 Osteomyelitis, unspecified: Secondary | ICD-10-CM

## 2013-07-27 DIAGNOSIS — R0789 Other chest pain: Principal | ICD-10-CM | POA: Insufficient documentation

## 2013-07-27 DIAGNOSIS — R531 Weakness: Secondary | ICD-10-CM

## 2013-07-27 DIAGNOSIS — Z91018 Allergy to other foods: Secondary | ICD-10-CM | POA: Insufficient documentation

## 2013-07-27 DIAGNOSIS — R079 Chest pain, unspecified: Secondary | ICD-10-CM

## 2013-07-27 DIAGNOSIS — F411 Generalized anxiety disorder: Secondary | ICD-10-CM

## 2013-07-27 LAB — RAPID URINE DRUG SCREEN, HOSP PERFORMED
Amphetamines: NOT DETECTED
BENZODIAZEPINES: NOT DETECTED
Barbiturates: NOT DETECTED
COCAINE: NOT DETECTED
Opiates: NOT DETECTED
Tetrahydrocannabinol: NOT DETECTED

## 2013-07-27 LAB — COMPREHENSIVE METABOLIC PANEL
ALK PHOS: 98 U/L (ref 39–117)
ALT: 78 U/L — ABNORMAL HIGH (ref 0–53)
AST: 44 U/L — ABNORMAL HIGH (ref 0–37)
Albumin: 4.1 g/dL (ref 3.5–5.2)
BILIRUBIN TOTAL: 0.5 mg/dL (ref 0.3–1.2)
BUN: 10 mg/dL (ref 6–23)
CHLORIDE: 102 meq/L (ref 96–112)
CO2: 26 mEq/L (ref 19–32)
Calcium: 9.6 mg/dL (ref 8.4–10.5)
Creatinine, Ser: 0.74 mg/dL (ref 0.50–1.35)
GFR calc non Af Amer: >90 mL/min (ref >90)
GLUCOSE: 89 mg/dL (ref 70–99)
POTASSIUM: 4 meq/L (ref 3.7–5.3)
Sodium: 141 mEq/L (ref 137–147)
TOTAL PROTEIN: 8.1 g/dL (ref 6.0–8.3)

## 2013-07-27 LAB — I-STAT TROPONIN, ED: Troponin i, poc: 0.01 ng/mL (ref 0.00–0.08)

## 2013-07-27 LAB — CBC
HCT: 47.4 % (ref 39.0–52.0)
Hemoglobin: 16.7 g/dL (ref 13.0–17.0)
MCH: 29.1 pg (ref 26.0–34.0)
MCHC: 35.2 g/dL (ref 30.0–36.0)
MCV: 82.7 fL (ref 78.0–100.0)
PLATELETS: 208 10*3/uL (ref 150–400)
RBC: 5.73 MIL/uL (ref 4.22–5.81)
RDW: 13.3 % (ref 11.5–15.5)
WBC: 6.3 10*3/uL (ref 4.0–10.5)

## 2013-07-27 LAB — TROPONIN I: Troponin I: <0.3 ng/mL (ref <0.30)

## 2013-07-27 LAB — PROTIME-INR
INR: 1.03 (ref 0.00–1.49)
PROTHROMBIN TIME: 13.3 s (ref 11.6–15.2)

## 2013-07-27 MED ORDER — ACETAMINOPHEN 650 MG RE SUPP: 650.0000 mg | Freq: Four times a day (QID) | RECTAL | Status: DC | PRN

## 2013-07-27 MED ORDER — ENOXAPARIN SODIUM 40 MG/0.4ML ~~LOC~~ SOLN
40.0000 mg | SUBCUTANEOUS | Status: DC
  Administered 2013-07-27: 40 mg via SUBCUTANEOUS
  Filled 2013-07-27 (×2): qty 0.4

## 2013-07-27 MED ORDER — ONDANSETRON HCL 4 MG/2ML IJ SOLN
4.0000 mg | Freq: Once | INTRAMUSCULAR | Status: AC
  Administered 2013-07-27: 4 mg via INTRAVENOUS
  Filled 2013-07-27: qty 2

## 2013-07-27 MED ORDER — SODIUM CHLORIDE 0.9 % IV SOLN
INTRAVENOUS | Status: DC
  Administered 2013-07-27: 13:00:00 via INTRAVENOUS

## 2013-07-27 MED ORDER — SODIUM CHLORIDE 0.9 % IV SOLN: 250.0000 mL | INTRAVENOUS | Status: DC | PRN

## 2013-07-27 MED ORDER — SODIUM CHLORIDE 0.9 % IJ SOLN
3.0000 mL | Freq: Two times a day (BID) | INTRAMUSCULAR | Status: DC
  Administered 2013-07-28: 3 mL via INTRAVENOUS

## 2013-07-27 MED ORDER — ONDANSETRON HCL 4 MG/2ML IJ SOLN: 4.0000 mg | Freq: Four times a day (QID) | INTRAMUSCULAR | Status: DC | PRN

## 2013-07-27 MED ORDER — ALPRAZOLAM 0.5 MG PO TABS
0.5000 mg | ORAL_TABLET | Freq: Once | ORAL | Status: AC
  Administered 2013-07-27: 0.5 mg via ORAL
  Filled 2013-07-27: qty 1

## 2013-07-27 MED ORDER — ONDANSETRON HCL 4 MG PO TABS: 4.0000 mg | ORAL_TABLET | Freq: Four times a day (QID) | ORAL | Status: DC | PRN

## 2013-07-27 MED ORDER — HYDROMORPHONE HCL PF 1 MG/ML IJ SOLN
1.0000 mg | Freq: Once | INTRAMUSCULAR | Status: AC
  Administered 2013-07-27: 1 mg via INTRAVENOUS
  Filled 2013-07-27: qty 1

## 2013-07-27 MED ORDER — ALPRAZOLAM 0.25 MG PO TABS: 0.2500 mg | ORAL_TABLET | Freq: Three times a day (TID) | ORAL | Status: DC | PRN

## 2013-07-27 MED ORDER — INFLUENZA VAC SPLIT QUAD 0.5 ML IM SUSP
0.5000 mL | INTRAMUSCULAR | Status: DC
  Filled 2013-07-27: qty 0.5

## 2013-07-27 MED ORDER — SODIUM CHLORIDE 0.9 % IJ SOLN: 3.0000 mL | INTRAMUSCULAR | Status: DC | PRN

## 2013-07-27 MED ORDER — ACETAMINOPHEN 325 MG PO TABS
650.0000 mg | ORAL_TABLET | Freq: Four times a day (QID) | ORAL | Status: DC | PRN
  Administered 2013-07-27: 650 mg via ORAL
  Filled 2013-07-27: qty 2

## 2013-07-27 NOTE — ED Provider Notes (Signed)
CSN: [id number]     Arrival date & time 07/27/13  1159 History   First MD Initiated Contact with Patient 07/27/13 1210     Chief Complaint  Patient presents with  . Chest Pain  . Numbness     (Consider location/radiation/quality/duration/timing/severity/associated sxs/prior Treatment) Patient is a 28 y.o. male presenting with chest pain. The history is provided by the patient. A language interpreter was used.  Chest Pain Associated symptoms: headache, numbness and weakness   Associated symptoms: no abdominal pain, no back pain, no cough, no fever, no nausea, no palpitations, no shortness of breath and not vomiting   pt c/o onset dull frontal/diffuse headache last pm.  States had been having some headache for past couple days, but worse last night. States ever since awakening this morning, left side of body feels numb and weak.  Symptoms constant. Last felt normal last evening before bed. Also states mid chest pain in past day, constant. Dull. Non radiating. No tearing/ripping. No pain to back. No associated nv, diaphoresis or sob. No hx cad. No pleuritic pain. No cough or uri c/o. No leg pain or swelling. No hx dvt or pe. No immobility, trauma, or recent travel.  Had surgery on left hand mid December, no other recent surgery. Non smoker, no drug use.   Additional hx from interpreter line, pt also states left face/head feels numb, and 'as if something is moving around in there'.  States gradual onset headache 2-3 days ago, slowly worse last night. Cp is described more as pressure sensation, present/constant since yesterday. No family hx cad, ?fam hx cva.     Past Medical History  Diagnosis Date  . Medical history non-contributory   . [id number](657.8)    Past Surgical History  Procedure Laterality Date  . Hand surgery  Dec. 2013, Jan. 2014    left hand  . 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 Wrist fusion with iliac crest bone graft Left 05/20/2013    Procedure: LEFT HAND BONE/TENDON RECONSTRUCTION WITH ILIAC CREST BONE GRAFT ;  Surgeon: Jodi Marble, MD;  Location: [facility];  Service: Orthopedics;  Laterality: Left;   Family History  Problem Relation Age of Onset  . Heart disease Mother   . Cataracts Mother    History  Substance Use Topics  . Smoking status: Never Smoker   . Smokeless tobacco: Current User    Types: Chew  . Alcohol Use: Yes     Comment: on special occasions    Review of Systems  Constitutional: Negative for fever and chills.  HENT: Negative for sore throat.   Eyes: Negative for redness.  Respiratory: Negative for cough and shortness of breath.   Cardiovascular: Positive for chest pain. Negative for palpitations and leg swelling.  Gastrointestinal: Negative for nausea, vomiting and abdominal pain.  Genitourinary: Negative for flank pain.  Musculoskeletal: Negative for back pain and neck pain.  Skin: Negative for rash.  Neurological: Positive for weakness, numbness and headaches.  Hematological: Does not bruise/bleed easily.  Psychiatric/Behavioral: Negative for confusion.      Allergies  Fruit & vegetable daily  Home Medications   Current Outpatient Rx  Name  Route  Sig  Dispense  Refill  . clindamycin (CLEOCIN) 300 MG capsule   Oral   Take 1 capsule (300 mg total)  by mouth 4 (four) times daily. X 7 days   28 capsule   0   . docusate sodium (COLACE) 100 MG capsule   Oral   Take 1 capsule (100 mg total) by mouth every 12 (twelve) hours.   60 capsule   0   . HYDROmorphone (DILAUDID) 2 MG tablet   Oral   Take 2 mg by mouth every 4 (four) hours as needed for severe pain.         Marland Kitchen oxyCODONE-acetaminophen (PERCOCET/ROXICET) 5-325 MG per tablet   Oral   Take 1 tablet by mouth every 4 (four) hours as needed  for severe pain.         . polyethylene glycol (MIRALAX / GLYCOLAX) packet   Oral   Take 17 g by mouth daily.   14 each   0    BP 132/89  Pulse 79  Temp(Src) 98.5 F (36.9 C) (Oral)  Resp 22  SpO2 98% Physical Exam  Nursing note and vitals reviewed. Constitutional: He is oriented to person, place, and time. He appears well-developed and well-nourished. No distress.  HENT:  Mouth/Throat: Oropharynx is clear and moist.  Eyes: Conjunctivae and EOM are normal. Pupils are equal, round, and reactive to light.  Neck: Neck supple. No tracheal deviation present. No thyromegaly present.  No bruit  Cardiovascular: Normal rate, regular rhythm, normal heart sounds and intact distal pulses.  Exam reveals no gallop and no friction rub.   No murmur heard. Pulmonary/Chest: Effort normal and breath sounds normal. No accessory muscle usage. No respiratory distress. He exhibits no tenderness.  Abdominal: Soft. Bowel sounds are normal. He exhibits no distension. There is no tenderness.  Musculoskeletal: Normal range of motion. He exhibits no edema and no tenderness.  CTLS spine, non tender, aligned, no step off.   Neurological: He is alert and oriented to person, place, and time. He displays normal reflexes. No cranial nerve deficit.  +left pronator drift. LUE/LLE weakness 4/5.  Exam somewhat inconsistent, w pronator drift testing w appears to be able to hold up left side some, but then drops to bed/wont lift against gravity. However can hold arm above head, and maintains down, does not drop across head/body.  Is able to stand, gait v slow/? left leg weakness.  Skin: Skin is warm and dry. He is not diaphoretic.  Psychiatric: He has a normal mood and affect.    ED Course  Procedures (including critical care time)   Results for orders placed during the hospital encounter of 07/27/13  [facility]      Result Value Ref Range   Prothrombin Time 13.3  11.6 - 15.2 seconds   INR 1.03  0.00 - 1.49   COMPREHENSIVE METABOLIC PANEL      Result Value Ref Range   Sodium 141  137 - 147 mEq/L   Potassium 4.0  3.7 - 5.3 mEq/L   Chloride 102  96 - 112 mEq/L   CO2 26  19 - 32 mEq/L   Glucose, Bld 89  70 - 99 mg/dL   BUN 10  6 - 23 mg/dL   Creatinine, Ser 1.61  0.50 - 1.35 mg/dL   Calcium 9.6  8.4 - 09.6 mg/dL   Total Protein 8.1  6.0 - 8.3 g/dL   Albumin 4.1  3.5 - 5.2 g/dL   AST 44 (*) 0 - 37 U/L   ALT 78 (*) 0 - 53 U/L   Alkaline Phosphatase 98  39 - 117 U/L   Total Bilirubin  0.5  0.3 - 1.2 mg/dL   GFR calc non Af Amer >90  >90 mL/min   GFR calc Af Amer >90  >90 mL/min  CBC      Result Value Ref Range   WBC 6.3  4.0 - 10.5 K/uL   RBC 5.73  4.22 - 5.81 MIL/uL   Hemoglobin 16.7  13.0 - 17.0 g/dL   HCT 11.9  14.7 - 82.9 %   MCV 82.7  78.0 - 100.0 fL   MCH 29.1  26.0 - 34.0 pg   MCHC 35.2  30.0 - 36.0 g/dL   RDW 56.2  13.0 - 86.5 %   Platelets 208  150 - 400 K/uL  URINE RAPID DRUG SCREEN (HOSP PERFORMED)      Result Value Ref Range   Opiates NONE DETECTED  NONE DETECTED   Cocaine NONE DETECTED  NONE DETECTED   Benzodiazepines NONE DETECTED  NONE DETECTED   Amphetamines NONE DETECTED  NONE DETECTED   Tetrahydrocannabinol NONE DETECTED  NONE DETECTED   Barbiturates NONE DETECTED  NONE DETECTED  I-STAT TROPOININ, ED      Result Value Ref Range   Troponin i, poc 0.01  0.00 - 0.08 ng/mL   Comment 3            Dg Chest 2 View  07/27/2013   CLINICAL DATA:  Chest pressure and pain  EXAM: CHEST  2 VIEW  COMPARISON:  None.  FINDINGS: The heart size and mediastinal contours are within normal limits. Both lungs are clear. The visualized skeletal structures are unremarkable.  IMPRESSION: No active cardiopulmonary disease.   Electronically Signed   By: Ruel Favors M.D.   On: 07/27/2013 14:04   Ct Head Wo Contrast  07/27/2013   CLINICAL DATA:  Left arm paresthesias, weakness, headache  EXAM: CT HEAD WITHOUT CONTRAST  TECHNIQUE: Contiguous axial images were obtained from the base of the  skull through the vertex without contrast.  COMPARISON:  None  FINDINGS: Normal appearance of the intracranial structures. No evidence for acute hemorrhage, mass lesion, midline shift, hydrocephalus or large infarct. No acute bony abnormality. The visualized sinuses are clear.  IMPRESSION: No acute intracranial abnormality.   Electronically Signed   By: Ruel Favors M.D.   On: 07/27/2013 12:57   Mr Brain Wo Contrast  07/27/2013   CLINICAL DATA:  CVA.  Left-sided weakness  EXAM: MRI HEAD WITHOUT CONTRAST  TECHNIQUE: Multiplanar, multiecho pulse sequences of the brain and surrounding structures were obtained without intravenous contrast.  COMPARISON:  CT head 07/27/2013  FINDINGS: Negative for acute infarct.  Ventricle size is normal.  Negative for hemorrhage or mass.  Scattered small deep white matter hyperintensities bilaterally. Brainstem and cerebellum are normal. Pituitary not enlarged. Negative for edema or midline shift.  Paranasal sinuses are clear.  IMPRESSION: Negative for acute infarct.  Scattered small hyperintensities in the deep white matter bilaterally. Given the patient's age, this could represent sequela of migraine headaches. Chronic ischemia, demyelinating disease are other considerations.   Electronically Signed   By: Marlan Palau M.D.   On: 07/27/2013 16:30     EKG Interpretation    Date/Time:  Sunday July 27 2013 12:03:25 EST Ventricular Rate:  79 PR Interval:  140 QRS Duration: 90 QT Interval:  352 QTC Calculation: 403 R Axis:   63 Text Interpretation:  Normal sinus rhythm Normal ECG No previous tracing Confirmed by Denton Lank  MD, Hong Timm ([id number]) on 07/27/2013 12:11:53 PM  MDM  Iv ns.   Labs. Ecg. Ct.  Reviewed nursing notes and prior charts for additional history.   Ct neg acute.  Mri results noted - will discuss w neurology/neurology to see/consult in ED.  Discussed with neuro consult, Dr Roseanne RenoStewart, as to their recommendations. He indicates feels pt  requires admission.  I discussed whether he felt was acute neurologic process, or functional disorder, given inconsistent exam findings - he indicates tied up w acute emergency/code cva, but he feels requires admission for additional testing, he will leave specifics later when able to finish note, requests admit to hospitalist service.  Discussed Dr Zannie KehrStewarts recommendations w [facility] hospitalist - he states admit temp orders, tele.        Suzi RootsKevin E Garvis Downum, MD 07/27/13 [phone]

## 2013-07-27 NOTE — ED Notes (Signed)
Per pt sts since yesterday he has had worsening left sided numbness and weakness. sts from his leg all the way up to his face. sts also severe frontal HA. Pt surgery on left arm back in December and sts he normally does good with physical therapy but unable to use hand presently. sts pressure in his chest and he feel like his heart is beating fast.

## 2013-07-27 NOTE — ED Notes (Signed)
Attempted to call report to [facility]W, was told receiving RN will call me back.

## 2013-07-27 NOTE — H&P (Signed)
PCP:   THOMPSON, DAVID A., MD   Chief Complaint:   Left arm numbness  HPI: 27-year-oldHispanic male, who speaks limited English but still able to explain and provide reasonably good history.as per patient he suffered injury to the left hand at work in December of 2013 which required major surgery and bone grafting, patient has been getting physical therapy but has noticed increased numbness in the left hand which has now moved up to middle of the left forearm. Last night patient says that he had a headache, and he took oxycodone. She also had anxiety feeling in the chest along with palpitations. Patient also complains of chest pressure. This morning patient noticed he had numbness and tingling of the left side of the body including left arm and leg and also left side of the face. Patient was seen in the ED, CT scan of the head was negative MRI also does not show acute stroke. Neuro was consulted and they will admit for further evaluation, as the MRI shows scattered small hyperintensities in the deep white matter bilaterally. Patient denies chest pain at this time, no fever no nausea vomiting or diarrhea. Still complains of some anxiety feeling.  Allergies:   Allergies  Allergen Reactions  . Fruit & Vegetable Daily [Nutritional Supplements] Other (See Comments)    Only allergy is to KIWI- closes throat      Past Medical History  Diagnosis Date  . Medical history non-contributory   . Headache(784.0)     Past Surgical History  Procedure Laterality Date  . Hand surgery  Dec. 2013, Jan. 2014    left hand  . 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 Wrist fusion with iliac crest bone graft Left 05/20/2013    Procedure: LEFT HAND BONE/TENDON RECONSTRUCTION WITH ILIAC CREST BONE  GRAFT ;  Surgeon: David A Thompson, MD;  Location: [facility];  Service: Orthopedics;  Laterality: Left;    Prior to Admission medications   Medication Sig Start Date End Date Taking? Authorizing Provider  oxyCODONE-acetaminophen (PERCOCET/ROXICET) 5-325 MG per tablet Take 1 tablet by mouth every 4 (four) hours as needed for severe pain.   Yes Historical Provider, MD    Social History:  reports that he has never smoked. His smokeless tobacco use includes Chew. He reports that he drinks alcohol. He reports that he does not use illicit drugs.  Family History  Problem Relation Age of Onset  . Heart disease Mother   . Cataracts Mother      All the positives are listed in BOLD  Review of Systems:  HEENT: Headache, blurred vision, runny nose, sore throat Neck: Hypothyroidism, hyperthyroidism,,lymphadenopathy Chest : Shortness of breath, history of COPD, Asthma Heart : Chest pain, history of coronary arterey disease GI:  Nausea, vomiting, diarrhea, constipation, GERD GU: Dysuria, urgency, frequency of urination, hematuria Neuro: Stroke, seizures, syncope Psych: Depression, anxiety, hallucinations   Physical Exam: Blood pressure 146/91, pulse 82, temperature 98.5 F (36.9 C), temperature source Oral, resp. rate 19, SpO2 94.00%. Constitutional:   Patient is a well-developed and well-nourished *male in no acute distress and cooperative with exam. Head: Normocephalic and atraumatic Mouth: Mucus membranes moist Eyes: PERRL, EOMI, conjunctivae normal Neck: Supple, No Thyromegaly Cardiovascular: RRR, S1 normal, S2 normal Pulmonary/Chest: CTAB, no wheezes, rales, or rhonchi Abdominal: Soft. Non-tender, non-distended, bowel sounds   are normal, no masses, organomegaly, or guarding present.  Neurological: A&O x3, Strenght is normal and symmetric bilaterally, cranial nerve II-XII are grossly intact, no focal motor deficit, reduced sensations in the left hand and left forearm.  Extremities : No  Cyanosis, Clubbing or Edema   Labs on Admission:  Results for orders placed during the hospital encounter of 07/27/13 (from the past 48 hour(s))  PROTIME-INR     Status: None   Collection Time    07/27/13  1:00 PM      Result Value Ref Range   Prothrombin Time 13.3  11.6 - 15.2 seconds   INR 1.03  0.00 - 1.49  COMPREHENSIVE METABOLIC PANEL     Status: Abnormal   Collection Time    07/27/13  1:00 PM      Result Value Ref Range   Sodium 141  137 - 147 mEq/L   Potassium 4.0  3.7 - 5.3 mEq/L   Chloride 102  96 - 112 mEq/L   CO2 26  19 - 32 mEq/L   Glucose, Bld 89  70 - 99 mg/dL   BUN 10  6 - 23 mg/dL   Creatinine, Ser 0.74  0.50 - 1.35 mg/dL   Calcium 9.6  8.4 - 10.5 mg/dL   Total Protein 8.1  6.0 - 8.3 g/dL   Albumin 4.1  3.5 - 5.2 g/dL   AST 44 (*) 0 - 37 U/L   ALT 78 (*) 0 - 53 U/L   Alkaline Phosphatase 98  39 - 117 U/L   Total Bilirubin 0.5  0.3 - 1.2 mg/dL   GFR calc non Af Amer >90  >90 mL/min   GFR calc Af Amer >90  >90 mL/min   Comment: (NOTE)     The eGFR has been calculated using the CKD EPI equation.     This calculation has not been validated in all clinical situations.     eGFR's persistently <90 mL/min signify possible Chronic Kidney     Disease.  CBC     Status: None   Collection Time    07/27/13  1:00 PM      Result Value Ref Range   WBC 6.3  4.0 - 10.5 K/uL   RBC 5.73  4.22 - 5.81 MIL/uL   Hemoglobin 16.7  13.0 - 17.0 g/dL   HCT 47.4  39.0 - 52.0 %   MCV 82.7  78.0 - 100.0 fL   MCH 29.1  26.0 - 34.0 pg   MCHC 35.2  30.0 - 36.0 g/dL   RDW 13.3  11.5 - 15.5 %   Platelets 208  150 - 400 K/uL  URINE RAPID DRUG SCREEN (HOSP PERFORMED)     Status: None   Collection Time    07/27/13  1:03 PM      Result Value Ref Range   Opiates NONE DETECTED  NONE DETECTED   Cocaine NONE DETECTED  NONE DETECTED   Benzodiazepines NONE DETECTED  NONE DETECTED   Amphetamines NONE DETECTED  NONE DETECTED   Tetrahydrocannabinol NONE DETECTED  NONE DETECTED   Barbiturates  NONE DETECTED  NONE DETECTED   Comment:            DRUG SCREEN FOR MEDICAL PURPOSES     ONLY.  IF CONFIRMATION IS NEEDED     FOR ANY PURPOSE, NOTIFY LAB     WITHIN 5 DAYS.                LOWEST DETECTABLE LIMITS       FOR URINE DRUG SCREEN     Drug Class       Cutoff (ng/mL)     Amphetamine      1000     Barbiturate      200     Benzodiazepine   099     Tricyclics       833     Opiates          300     Cocaine          300     THC              64  I-STAT TROPOININ, ED     Status: None   Collection Time    07/27/13  1:34 PM      Result Value Ref Range   Troponin i, poc 0.01  0.00 - 0.08 ng/mL   Comment 3            Comment: Due to the release kinetics of cTnI,     a negative result within the first hours     of the onset of symptoms does not rule out     myocardial infarction with certainty.     If myocardial infarction is still suspected,     repeat the test at appropriate intervals.  TROPONIN I     Status: None   Collection Time    07/27/13  5:17 PM      Result Value Ref Range   Troponin I <0.30  <0.30 ng/mL   Comment:            Due to the release kinetics of cTnI,     a negative result within the first hours     of the onset of symptoms does not rule out     myocardial infarction with certainty.     If myocardial infarction is still suspected,     repeat the test at appropriate intervals.    Radiological Exams on Admission: Dg Chest 2 View  07/27/2013   CLINICAL DATA:  Chest pressure and pain  EXAM: CHEST  2 VIEW  COMPARISON:  None.  FINDINGS: The heart size and mediastinal contours are within normal limits. Both lungs are clear. The visualized skeletal structures are unremarkable.  IMPRESSION: No active cardiopulmonary disease.   Electronically Signed   By: Daryll Brod M.D.   On: 07/27/2013 14:04   Ct Head Wo Contrast  07/27/2013   CLINICAL DATA:  Left arm paresthesias, weakness, headache  EXAM: CT HEAD WITHOUT CONTRAST  TECHNIQUE: Contiguous axial images were  obtained from the base of the skull through the vertex without contrast.  COMPARISON:  None  FINDINGS: Normal appearance of the intracranial structures. No evidence for acute hemorrhage, mass lesion, midline shift, hydrocephalus or large infarct. No acute bony abnormality. The visualized sinuses are clear.  IMPRESSION: No acute intracranial abnormality.   Electronically Signed   By: Daryll Brod M.D.   On: 07/27/2013 12:57   Mr Brain Wo Contrast  07/27/2013   CLINICAL DATA:  CVA.  Left-sided weakness  EXAM: MRI HEAD WITHOUT CONTRAST  TECHNIQUE: Multiplanar, multiecho pulse sequences of the brain and surrounding structures were obtained without intravenous contrast.  COMPARISON:  CT head 07/27/2013  FINDINGS: Negative for acute infarct.  Ventricle size is normal.  Negative for hemorrhage or mass.  Scattered small deep white matter hyperintensities bilaterally. Brainstem and cerebellum are normal. Pituitary not enlarged. Negative for edema or midline shift.  Paranasal sinuses are clear.  IMPRESSION: Negative for acute infarct.  Scattered small hyperintensities in the deep white matter bilaterally. Given the patient's age, this could represent sequela of migraine headaches. Chronic ischemia, demyelinating disease are other considerations.   Electronically Signed   By: Charles  Clark M.D.   On: 07/27/2013 16:30    Assessment/Plan Principal Problem:   Left arm numbness Active Problems:   Left-sided weakness   Chest pain   Anxiety  Left arm numbness ? Cause, likely due to the left hand injury. MRI shows scattered small hyperintensities in the deep white matter bilaterally, neurology has seen the patient and wants to admit for further evaluation. MRI showed no acute stroke.  Chest pain Likely atypical, patient has been complaining of increasing stress at home over the past few days. We'll give 1 dose of Xanax 0.5 mg x1, and Xanax 0.25 every 8 hours when necessary.Will check cardiac enzymes x3. EKG shows  normal sinus rhythm  Anxiety Xanax when necessary   Code status:Full code  Family discussion:discussed with patient's wife at bedside   Time Spent on Admission: 55 minutes  LAMA,GAGAN S [facility] Pager: [phone] 07/27/2013, 9:10 PM  If 7PM-7AM, please contact night-coverage  [email or web address]  Password [email or web address]   

## 2013-07-27 NOTE — ED Notes (Signed)
Neuro MD at bedside

## 2013-07-27 NOTE — ED Notes (Addendum)
Patient transported to CT 

## 2013-07-27 NOTE — ED Notes (Signed)
Patient transported to MRI 

## 2013-07-27 NOTE — ED Notes (Signed)
MD at bedside. 

## 2013-07-27 NOTE — Consult Note (Signed)
Reason for Consult: Left-sided weakness and numbness Referring Physician: Dr Denton Lank  CC: Left-sided weakness and numbness  HPI: Keith Nash is a 28 y[place]o. [place] who speaks limited English and presents to the emergency department today for evaluation of left-sided numbness and weakness since yesterday. The patient's history was obtained with a translator on the telephone. The patient suffered an injury to his left hand at work in December of 2013. This required major surgery and bone grafting. He is participating in physical therapy 3 times per week; however, he still has limited use of his left hand. He has been out of work since that time and he lives with his wife as well as multiple family members. He has had a great deal of emotional stress related to his work situation as well as family issues and feels anxious and depressed. He would like to speak with a counselor regarding his emotional issues.  Over the past several days he has had gradual onset of a severe headache. He denies history of migraine headaches. He has had associated symptoms including weakness and numbness of his left side, chest pressure, shortness of breath, tingling of the left side of his body, and a rapid heart rate. He had difficulty walking today secondary to left lower extremity weakness and required assistance from his wife. He feels the symptoms are somewhat improved since arriving in the emergency department and notes increased strength on the left.  The patient had an MRI today that was negative for acute infarct.  It did show scattered small hyperintensities in the deep white matter bilaterally. Given the patient's age, the radiologist felt that this could represent sequela of migraine headaches. Chronic ischemia, demyelinating disease are other considerations.     Neurology was asked to evaluate the patient. The patient's wife was in the room but does not speak English and was not able to assist  with the history.   Past Medical History  Diagnosis Date  . Medical history non-contributory   . [id number](478.2)     Past Surgical History  Procedure Laterality Date  . Hand surgery  Dec. 2013, Jan. 2014    left hand  . 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 Wrist fusion with iliac crest bone graft Left 05/20/2013    Procedure: LEFT HAND BONE/TENDON RECONSTRUCTION WITH ILIAC CREST BONE GRAFT ;  Surgeon: Jodi Marble, MD;  Location: [facility];  Service: Orthopedics;  Laterality: Left;    Family History  Problem Relation Age of Onset  . Heart disease Mother deceased from some sort of lung problems.   . Cataracts Mother    His father is alive and well.  Social History:  reports that he has never smoked. His smokeless tobacco use includes Chew. He reports that he drinks alcohol. He reports that he does not use illicit drugs. He lives with multiple family members. He has been out of work since his left hand injury December 2013.  Allergies  Allergen Reactions  . Fruit & Vegetable Daily [Nutritional Supplements] Other (See Comments)    Only allergy is to KIWI- closes throat    Medications: Current Facility-Administered Medications  Medication Dose Route Frequency Provider Last Rate Last Dose  . 0.9 %  sodium chloride infusion   Intravenous Continuous Suzi Roots, MD 20  mL/hr at 07/27/13 1307     Current Outpatient Prescriptions  Medication Sig Dispense Refill  . oxyCODONE-acetaminophen (PERCOCET/ROXICET) 5-325 MG per tablet Take 1 tablet by mouth every 4 (four) hours as needed for severe pain.        I have reviewed the patient's current medications.  ROS: History obtained from the patient  General ROS: negative for - chills, fatigue, fever, night sweats,  weight gain or weight loss Psychological ROS: negative for - behavioral disorder, hallucinations, memory difficulties, mood swings or suicidal ideation Ophthalmic ROS: negative for - blurry vision, double vision, eye pain or loss of vision ENT ROS: negative for - epistaxis, nasal discharge, oral lesions, sore throat, tinnitus or vertigo Allergy and Immunology ROS: negative for - hives or itchy/watery eyes Hematological and Lymphatic ROS: negative for - bleeding problems, bruising or swollen lymph nodes Endocrine ROS: negative for - galactorrhea, hair pattern changes, polydipsia/polyuria or temperature intolerance Respiratory ROS: negative for - cough, hemoptysis, shortness of breath or wheezing Cardiovascular ROS: negative for - chest pain, dyspnea on exertion, edema or irregular heartbeat Gastrointestinal ROS: negative for - abdominal pain, diarrhea, hematemesis, nausea/vomiting or stool incontinence Genito-Urinary ROS: negative for - dysuria, hematuria, incontinence or urinary frequency/urgency Musculoskeletal ROS: negative for - joint swelling or muscular weakness Neurological ROS: as noted in HPI Dermatological ROS: negative for rash and skin lesion changes  Review of systems was negative except for the issues documented in the history of present illness.   Physical Examination: Blood pressure 128/82, pulse 99, temperature 98.5 F (36.9 C), temperature source Oral, resp. rate 19, SpO2 95.00%.  Neurologic Examination Mental Status: Alert, oriented, thought content appropriate.  Speech fluent without evidence of aphasia.  Able to follow 3 step commands without difficulty. Cranial Nerves: II: Discs not visualized; Visual fields grossly normal, pupils equal, round, reactive to light and accommodation III,IV, VI: ptosis not present, extra-ocular motions intact bilaterally V,VII: smile reveals left lower facial droop, facial light touch sensation decreased on the left. VIII: hearing normal  bilaterally IX,X: gag reflex present XI: bilateral shoulder shrug XII: midline tongue extension Motor: Motor strength on the right was 5 over 5 in both the upper and lower extremities. The left upper extremity reveals a deformity of the hand following an injury and subsequent surgery. There is edema and a surgical scar. The patient has limited use of his fingers on this hand. He was noted to have a very slight drift of the upper extremity. The left lower extremity strength was 4/5. Tone and bulk:normal tone throughout; no atrophy noted Sensory: There was decreased sensation to light touch in both the left upper and lower extremities. Deep Tendon Reflexes: 1+ and symmetric throughout except for the right upper extremity which is 2+ brachial. Plantars: Right: downgoing   Left: downgoing Cerebellar: normal finger-to-nose with the right upper extremity. Unable to perform with the left upper extremity. normal heel-to-shin test with the right leg. Significant difficulty using the left leg. Gait: Deferred for safety reasons.  Laboratory Studies:   Basic Metabolic Panel:  Recent Labs Lab 07/27/13 1300  NA 141  K 4.0  CL 102  CO2 26  GLUCOSE 89  BUN 10  CREATININE 0.74  CALCIUM 9.6    Liver Function Tests:  Recent Labs Lab 07/27/13 1300  AST 44*  ALT 78*  ALKPHOS 98  BILITOT 0.5  PROT 8.1  ALBUMIN 4.1   No results found for this basename: LIPASE, AMYLASE,  in the last 168 hours No results found for this  basename: AMMONIA,  in the last 168 hours  CBC:  Recent Labs Lab 07/27/13 1300  WBC 6.3  HGB 16.7  HCT 47.4  MCV 82.7  PLT 208    Cardiac Enzymes: No results found for this basename: CKTOTAL, CKMB, CKMBINDEX, TROPONINI,  in the last 168 hours  BNP: No components found with this basename: POCBNP,   CBG: No results found for this basename: GLUCAP,  in the last 168 hours  Microbiology: Results for orders placed during the hospital encounter of 05/24/13   CULTURE, BLOOD (ROUTINE X 2)     Status: None   Collection Time    05/24/13  2:25 AM      Result Value Ref Range Status   Specimen Description BLOOD RIGHT ARM   Final   Special Requests BOTTLES DRAWN AEROBIC AND ANAEROBIC 10CC EACH   Final   Culture  Setup Time     Final   Value: 05/24/2013 14:12     Performed at [facility]   Culture     Final   Value: NO GROWTH 5 DAYS     Performed at [facility]   Report Status 05/30/2013 FINAL   Final  CULTURE, BLOOD (ROUTINE X 2)     Status: None   Collection Time    05/24/13  2:30 AM      Result Value Ref Range Status   Specimen Description BLOOD LEFT ARM   Final   Special Requests BOTTLES DRAWN AEROBIC ONLY 10CC   Final   Culture  Setup Time     Final   Value: 05/24/2013 14:12     Performed at [facility]   Culture     Final   Value: NO GROWTH 5 DAYS     Performed at [facility]   Report Status 05/30/2013 FINAL   Final    Coagulation Studies:  Recent Labs  07/27/13 1300  LABPROT 13.3  INR 1.03    Urinalysis: No results found for this basename: COLORURINE, APPERANCEUR, LABSPEC, PHURINE, GLUCOSEU, HGBUR, BILIRUBINUR, KETONESUR, PROTEINUR, UROBILINOGEN, NITRITE, LEUKOCYTESUR,  in the last 168 hours  Lipid Panel:  No results found for this basename: chol, trig, hdl, cholhdl, vldl, ldlcalc    HgbA1C:  No results found for this basename: HGBA1C    Urine Drug Screen:     Component Value Date/Time   LABOPIA NONE DETECTED 07/27/2013 1303   COCAINSCRNUR NONE DETECTED 07/27/2013 1303   LABBENZ NONE DETECTED 07/27/2013 1303   AMPHETMU NONE DETECTED 07/27/2013 1303   THCU NONE DETECTED 07/27/2013 1303   LABBARB NONE DETECTED 07/27/2013 1303    Alcohol Level: No results found for this basename: ETH,  in the last 168 hours  Other results: EKG: Sinus rhythm rate 79 beats per minute. Please see cardiology interpretation for complete details.  Imaging:  Dg Chest 2 View 07/27/2013    No active  cardiopulmonary disease.    Ct Head Wo Contrast 07/27/2013    No acute intracranial abnormality.     Mr Brain Wo Contrast 07/27/2013    Negative for acute infarct.  Scattered small hyperintensities in the deep white matter bilaterally. Given the patient's age, this could represent sequela of migraine headaches. Chronic ischemia, demyelinating disease are other considerations.     Assessment/Plan:  28 year old male with a history of a left upper extremity work related injury from December of 2013 presents to the emergency department for evaluation of multiple symptoms some of which are no doubt related to stress, anxiety,  and depression. The patient does have left sided weakness with left hemisensory deficits. An MRI was negative for an infarct; although, the radiologist did offer a differential diagnosis as noted above.  Delton See PA-C [facility] Pager [phone] 07/27/2013, 6:21 PM   Acute left hemiparesis of unclear etiology. No stroke per MRI of the brain. TIA cannot be completely ruled out. Suspect psychogenic factors contributing to patient's apparent deficits.  Recommend:  1. Asprin 325 mg/day  2. PT and OT consults 3. HA1c and FLP 4. Carotid doppler and echocardiogram 5. Studies to r/o hypercoagulable state  C.R. Roseanne Reno MD [facility] [phone]

## 2013-07-27 NOTE — ED Notes (Addendum)
Neuro PA at bedside.  

## 2013-07-27 NOTE — ED Notes (Signed)
Called XR to inform pt ready for scan.

## 2013-07-28 LAB — CBC
HCT: 43.7 % (ref 39.0–52.0)
HEMOGLOBIN: 14.8 g/dL (ref 13.0–17.0)
MCH: 28.2 pg (ref 26.0–34.0)
MCHC: 33.9 g/dL (ref 30.0–36.0)
MCV: 83.2 fL (ref 78.0–100.0)
PLATELETS: 200 10*3/uL (ref 150–400)
RBC: 5.25 MIL/uL (ref 4.22–5.81)
RDW: 13.2 % (ref 11.5–15.5)
WBC: 6.7 10*3/uL (ref 4.0–10.5)

## 2013-07-28 LAB — COMPREHENSIVE METABOLIC PANEL
ALT: 66 U/L — ABNORMAL HIGH (ref 0–53)
AST: 40 U/L — AB (ref 0–37)
Albumin: 3.5 g/dL (ref 3.5–5.2)
Alkaline Phosphatase: 83 U/L (ref 39–117)
BUN: 13 mg/dL (ref 6–23)
CHLORIDE: 103 meq/L (ref 96–112)
CO2: 23 meq/L (ref 19–32)
CREATININE: 0.89 mg/dL (ref 0.50–1.35)
Calcium: 8.8 mg/dL (ref 8.4–10.5)
GLUCOSE: 150 mg/dL — AB (ref 70–99)
Potassium: 3.7 mEq/L (ref 3.7–5.3)
Sodium: 140 mEq/L (ref 137–147)
Total Bilirubin: 0.5 mg/dL (ref 0.3–1.2)
Total Protein: 7 g/dL (ref 6.0–8.3)

## 2013-07-28 LAB — TROPONIN I
Troponin I: <0.3 ng/mL (ref <0.30)
Troponin I: <0.3 ng/mL (ref <0.30)

## 2013-07-28 MED ORDER — GABAPENTIN 300 MG PO CAPS: 300.0000 mg | ORAL_CAPSULE | Freq: Three times a day (TID) | ORAL | Status: DC

## 2013-07-28 MED ORDER — KETOROLAC TROMETHAMINE 30 MG/ML IJ SOLN
30.0000 mg | Freq: Once | INTRAMUSCULAR | Status: AC
  Administered 2013-07-28: 30 mg via INTRAVENOUS
  Filled 2013-07-28: qty 1

## 2013-07-28 NOTE — Discharge Instructions (Addendum)
Do not operate machinery as the medication Neurontin can make you drowsy. If you continue to be dizzy, stop taking this medication and call your PCP

## 2013-07-28 NOTE — Discharge Summary (Signed)
Physician Discharge Summary  Keith Nash ZOX:[id number] DOB: [birth year] DOA: 07/27/2013  PCP: Jodi Marble., MD  Admit date: 07/27/2013 Discharge date: 07/28/2013  Time spent: 50* minutes  Recommendations for Outpatient Follow-up:  1. Follow up PCP in 2 weeks  Discharge Diagnoses:  Principal Problem:   Left arm numbness Active Problems:   Left-sided weakness   Chest pain   Anxiety   Discharge Condition: Stable  Diet recommendation: Regular diet  Filed Weights   07/27/13 2150  Weight: 121.564 kg (268 lb)    History of present illness:  19 year oldHispanic male, who speaks limited English but still able to explain and provide reasonably good history.as per patient he suffered injury to the left hand at work in December of 2013 which required major surgery and bone grafting, patient has been getting physical therapy but has noticed increased numbness in the left hand which has now moved up to middle of the left forearm.  Last night patient says that he had a headache, and he took oxycodone. She also had anxiety feeling in the chest along with palpitations. Patient also complains of chest pressure. This morning patient noticed he had numbness and tingling of the left side of the body including left arm and leg and also left side of the face.  Patient was seen in the ED, CT scan of the head was negative MRI also does not show acute stroke. Neuro was consulted and they will admit for further evaluation, as the MRI shows scattered small hyperintensities in the deep white matter bilaterally.  Patient denies chest pain at this time, no fever no nausea vomiting or diarrhea. Still complains of some anxiety feeling   Hospital Course:   Complicated migraine- patient was seen by Neurology who diagnosed the patient with  Possible complicated migraine and Dr Petra Kuba and called and discussed with me and recommended Neurontin 300 mg po TID if patient continues to have migraine.  Discussed with patient, and he would like to try the neurontin.  Mild transaminitis- Patient has mildly elevated liver enzymes, no nausea or vomiting. He will need repeat LFT's in 4 weeks as outpatient.  Will discharge the patent on Neurontin 300 mg poTID  Procedures:  None  Consultations:  Neurology  Discharge Exam: Filed Vitals:   07/28/13 0656  BP: 111/66  Pulse: 60  Temp:   Resp:     General: *Appear in no acute distress Cardiovascular: S1s2 RRR Respiratory: Clear bilaterally  Discharge Instructions  Discharge Orders   Future Orders Complete By Expires   Diet - low sodium heart healthy  As directed    Increase activity slowly  As directed        Medication List         gabapentin 300 MG capsule  Commonly known as:  NEURONTIN  Take 1 capsule (300 mg total) by mouth 3 (three) times daily.     oxyCODONE-acetaminophen 5-325 MG per tablet  Commonly known as:  PERCOCET/ROXICET  Take 1 tablet by mouth every 4 (four) hours as needed for severe pain.       Allergies  Allergen Reactions  . Fruit & Vegetable Daily [Nutritional Supplements] Other (See Comments)    Only allergy is to KIWI- closes throat       Follow-up Information   Follow up with THOMPSON, DAVID A., MD In 2 weeks.   Specialty:  Orthopedic Surgery   Contact information:   [address]. [place] [address] [phone]        The  results of significant diagnostics from this hospitalization (including imaging, microbiology, ancillary and laboratory) are listed below for reference.    Significant Diagnostic Studies: Dg Chest 2 View  07/27/2013   CLINICAL DATA:  Chest pressure and pain  EXAM: CHEST  2 VIEW  COMPARISON:  None.  FINDINGS: The heart size and mediastinal contours are within normal limits. Both lungs are clear. The visualized skeletal structures are unremarkable.  IMPRESSION: No active cardiopulmonary disease.   Electronically Signed   By: Ruel Favorsrevor  Shick M.D.   On: 07/27/2013  14:04   Ct Head Wo Contrast  07/27/2013   CLINICAL DATA:  Left arm paresthesias, weakness, headache  EXAM: CT HEAD WITHOUT CONTRAST  TECHNIQUE: Contiguous axial images were obtained from the base of the skull through the vertex without contrast.  COMPARISON:  None  FINDINGS: Normal appearance of the intracranial structures. No evidence for acute hemorrhage, mass lesion, midline shift, hydrocephalus or large infarct. No acute bony abnormality. The visualized sinuses are clear.  IMPRESSION: No acute intracranial abnormality.   Electronically Signed   By: Ruel Favorsrevor  Shick M.D.   On: 07/27/2013 12:57   Mr Brain Wo Contrast  07/27/2013   CLINICAL DATA:  CVA.  Left-sided weakness  EXAM: MRI HEAD WITHOUT CONTRAST  TECHNIQUE: Multiplanar, multiecho pulse sequences of the brain and surrounding structures were obtained without intravenous contrast.  COMPARISON:  CT head 07/27/2013  FINDINGS: Negative for acute infarct.  Ventricle size is normal.  Negative for hemorrhage or mass.  Scattered small deep white matter hyperintensities bilaterally. Brainstem and cerebellum are normal. Pituitary not enlarged. Negative for edema or midline shift.  Paranasal sinuses are clear.  IMPRESSION: Negative for acute infarct.  Scattered small hyperintensities in the deep white matter bilaterally. Given the patient's age, this could represent sequela of migraine headaches. Chronic ischemia, demyelinating disease are other considerations.   Electronically Signed   By: Marlan Palauharles  Clark M.D.   On: 07/27/2013 16:30    Microbiology: No results found for this or any previous visit (from the past 240 hour(s)).   Labs: Basic Metabolic Panel:  Recent Labs Lab 07/27/13 1300 07/28/13 0535  NA 141 140  K 4.0 3.7  CL 102 103  CO2 26 23  GLUCOSE 89 150*  BUN 10 13  CREATININE 0.74 0.89  CALCIUM 9.6 8.8   Liver Function Tests:  Recent Labs Lab 07/27/13 1300 07/28/13 0535  AST 44* 40*  ALT 78* 66*  ALKPHOS 98 83  BILITOT 0.5 0.5   PROT 8.1 7.0  ALBUMIN 4.1 3.5   No results found for this basename: LIPASE, AMYLASE,  in the last 168 hours No results found for this basename: AMMONIA,  in the last 168 hours CBC:  Recent Labs Lab 07/27/13 1300 07/28/13 0535  WBC 6.3 6.7  HGB 16.7 14.8  HCT 47.4 43.7  MCV 82.7 83.2  PLT 208 200   Cardiac Enzymes:  Recent Labs Lab 07/27/13 1717 07/27/13 2308 07/28/13 0535  TROPONINI <0.30 <0.30 <0.30   BNP: BNP (last 3 results) No results found for this basename: PROBNP,  in the last 8760 hours CBG: No results found for this basename: GLUCAP,  in the last 168 hours     Signed:  Tonee Silverstein S  [facility] 07/28/2013, 10:32 AM

## 2013-07-28 NOTE — Progress Notes (Signed)
D/c orders received;IV removed with gauze on, pt meds and instructions reviewed and given to pt; pt remains in stable condition, pt d/c to home 

## 2013-07-28 NOTE — Care Management Note (Addendum)
  Page 1 of 1   07/28/2013     11:37:59 AM   CARE MANAGEMENT NOTE 07/28/2013  Patient:  Keith Nash,Keith Nash   Account Number:  [id number]  Date Initiated:  07/28/2013  Documentation initiated by:  GRAVES-BIGELOW,Milinda Sweeney  Subjective/Objective Assessment:   Pt admitted for Left arm numbness. Plan for d/c home today on neurontin.     Action/Plan:   CM did call the [facility] for F/u PCP appointment. Pt has no insurance. CM did call [facility] and cost of medication will be 44.85. No further needs from CM at this time.   Anticipated DC Date:  07/28/2013   Anticipated DC Plan:  HOME/SELF CARE      DC Planning Services  CM consult  Medication Assistance  Follow-up appt scheduled      Choice offered to / List presented to:             Status of service:  Completed, signed off Medicare Important Message given?   (If response is "NO", the following Medicare IM given date fields will be blank) Date Medicare IM given:   Date Additional Medicare IM given:    Discharge Disposition:  HOME/SELF CARE  Per UR Regulation:  Reviewed for med. necessity/level of care/duration of stay  If discussed at Long Length of Stay Meetings, dates discussed:    Comments:  07-28-13 [address] [place], [place] [phone] CM did call Andris FlurryLinda Swaim Case Manager for [employer]- had to leave a vm.  [phone] and [phone]. CM was able to speak to Workers Comp [employer]. Claim Number [id number]. Per CM pt will be set up with PCP to draw labs. Workers comp to set up. No further needs from CM at this time.

## 2013-07-28 NOTE — Progress Notes (Addendum)
Subjective: Feels much better this morning.   Exam: Filed Vitals:   07/28/13 0656  BP: 111/66  Pulse: 60  Temp:   Resp:    Gen: In bed, NAD MS: awake, alert, interactive and appropriate FA:OZHYQCN:PERRL, EOMI, VFF, facial movement symemtric.  Motor: no drift. 5/5 strength in arm and leg(excluding left hadn due to previous injury) Sensory:intact to LT with the exception of the left arm   Impression: 28 yo M with left sided weakness and paresthesias that has been followed by headache. He has a history of intermittent unilateral headaches that are typically relieved with tylenol. His MRI would be consistent with migraine as well. With positive prolonged symptoms, I feel that TIA is not at all likely. He had facial involvement on my partner's exam so I feel that cervical disease also is not likely. Possibilities include complicated migraine vs conversion disorder. Without prominent symptoms during my exam, conversion is difficult to be conclusive about and with a headache, I suspect complicated migraine.   Recommendations: 1) Will give toradol 30mg  x 1 for complicated migraine.    Ritta SlotMcNeill Zaylei Mullane, MD [facility] [phone]  If 7pm- 7am, please page neurology on call at [phone].

## 2013-10-07 ENCOUNTER — Encounter (HOSPITAL_COMMUNITY): Payer: Self-pay | Admitting: Emergency Medicine

## 2013-10-07 DIAGNOSIS — R109 Unspecified abdominal pain: Secondary | ICD-10-CM | POA: Insufficient documentation

## 2013-10-07 NOTE — ED Notes (Signed)
Pt. reports intermittent periumbilical pain onset November last year worse these past several days , denies injury , no nausea / vomitting or diarrhea.

## 2013-10-08 ENCOUNTER — Emergency Department (HOSPITAL_COMMUNITY)
Admission: EM | Admit: 2013-10-08 | Discharge: 2013-10-08 | Disposition: A | Discharge status: Home / Self Care | Payer: Self-pay | Attending: Emergency Medicine | Admitting: Emergency Medicine

## 2013-10-08 ENCOUNTER — Emergency Department (HOSPITAL_COMMUNITY): Payer: Self-pay

## 2013-10-08 DIAGNOSIS — R109 Unspecified abdominal pain: Secondary | ICD-10-CM

## 2013-10-08 LAB — URINALYSIS, ROUTINE W REFLEX MICROSCOPIC
BILIRUBIN URINE: NEGATIVE
Glucose, UA: NEGATIVE mg/dL
HGB URINE DIPSTICK: NEGATIVE
KETONES UR: NEGATIVE mg/dL
Leukocytes, UA: NEGATIVE
NITRITE: NEGATIVE
Protein, ur: NEGATIVE mg/dL
Specific Gravity, Urine: 1.02 (ref 1.005–1.030)
UROBILINOGEN UA: 0.2 mg/dL (ref 0.0–1.0)
pH: 6.5 (ref 5.0–8.0)

## 2013-10-08 LAB — COMPREHENSIVE METABOLIC PANEL
ALK PHOS: 115 U/L (ref 39–117)
ALT: 55 U/L — AB (ref 0–53)
AST: 42 U/L — AB (ref 0–37)
Albumin: 3.9 g/dL (ref 3.5–5.2)
BILIRUBIN TOTAL: 0.3 mg/dL (ref 0.3–1.2)
BUN: 10 mg/dL (ref 6–23)
CHLORIDE: 101 meq/L (ref 96–112)
CO2: 26 meq/L (ref 19–32)
Calcium: 9.4 mg/dL (ref 8.4–10.5)
Creatinine, Ser: 0.78 mg/dL (ref 0.50–1.35)
GLUCOSE: 100 mg/dL — AB (ref 70–99)
POTASSIUM: 4.2 meq/L (ref 3.7–5.3)
SODIUM: 141 meq/L (ref 137–147)
Total Protein: 7.8 g/dL (ref 6.0–8.3)

## 2013-10-08 LAB — CBC WITH DIFFERENTIAL/PLATELET
BASOS ABS: 0.1 10*3/uL (ref 0.0–0.1)
Basophils Relative: 1 % (ref 0–1)
EOS PCT: 1 % (ref 0–5)
Eosinophils Absolute: 0.1 10*3/uL (ref 0.0–0.7)
HEMATOCRIT: 45.3 % (ref 39.0–52.0)
HEMOGLOBIN: 15.4 g/dL (ref 13.0–17.0)
LYMPHS ABS: 3.4 10*3/uL (ref 0.7–4.0)
LYMPHS PCT: 42 % (ref 12–46)
MCH: 28.2 pg (ref 26.0–34.0)
MCHC: 34 g/dL (ref 30.0–36.0)
MCV: 83 fL (ref 78.0–100.0)
MONOS PCT: 9 % (ref 3–12)
Monocytes Absolute: 0.7 10*3/uL (ref 0.1–1.0)
NEUTROS PCT: 47 % (ref 43–77)
Neutro Abs: 3.7 10*3/uL (ref 1.7–7.7)
PLATELETS: 239 10*3/uL (ref 150–400)
RBC: 5.46 MIL/uL (ref 4.22–5.81)
RDW: 13.4 % (ref 11.5–15.5)
WBC: 8 10*3/uL (ref 4.0–10.5)

## 2013-10-08 MED ORDER — TRAMADOL HCL 50 MG PO TABS: 50.0000 mg | ORAL_TABLET | Freq: Four times a day (QID) | ORAL | Status: AC | PRN

## 2013-10-08 MED ORDER — IOHEXOL 300 MG/ML  SOLN
100.0000 mL | Freq: Once | INTRAMUSCULAR | Status: AC | PRN
  Administered 2013-10-08: 100 mL via INTRAVENOUS

## 2013-10-08 NOTE — ED Notes (Signed)
CT called and informed pt has finished his contrast.  

## 2013-10-08 NOTE — ED Notes (Signed)
MD at bedside. 

## 2013-10-08 NOTE — ED Provider Notes (Signed)
CSN: [id number]     Arrival date & time 10/07/13  2325 History   First MD Initiated Contact with Patient 10/08/13 [phone]     Chief Complaint  Patient presents with  . Abdominal Pain     (Consider location/radiation/quality/duration/timing/severity/associated sxs/prior Treatment) Patient is a 28 y.o. male presenting with abdominal pain. The history is provided by the patient.  Abdominal Pain He has been having intermittent periumbilical pain for the last 6 months. He has been going through physical therapy and pain seemed to come on when he was undergoing physical therapy. As he has been increasing amount of weight that he is lifting, pain seems to be getting worse. Pain has been worse over the last several days and he rated pain at 7/10 when he came to the ED. It has subsided and is now down to 3/10. There is no associated nausea, vomiting, diarrhea and he denies fever, chills, sweats. He denies any change in appetite. Pain is worse with lifting but nothing makes it better. He has taken acetaminophen and ibuprofen which sometimes give relief.  Past Medical History  Diagnosis Date  . Medical history non-contributory   . [id number](272.5Headache(784.0)    Past Surgical History  Procedure Laterality Date  . Hand surgery  Dec. 2013, Jan. 2014    left hand  . 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 Wrist fusion with iliac crest bone graft Left 05/20/2013    Procedure: LEFT HAND BONE/TENDON RECONSTRUCTION WITH ILIAC CREST BONE GRAFT ;  Surgeon: Jodi Marbleavid A Thompson, MD;  Location: [facility] OR;  Service: Orthopedics;  Laterality: Left;   Family History  Problem Relation Age of Onset  . Heart disease Mother   . Cataracts Mother    History  Substance Use Topics  . Smoking status: Never Smoker   .  Smokeless tobacco: Current User    Types: Chew  . Alcohol Use: Yes     Comment: on special occasions    Review of Systems  Gastrointestinal: Positive for abdominal pain.  All other systems reviewed and are negative.     Allergies  Fruit & vegetable daily  Home Medications   Prior to Admission medications   Not on File   BP 136/70  Pulse 81  Temp(Src) 98.2 F (36.8 C) (Oral)  Resp 18  Ht 6\' 4"  (1.93 m)  Wt 280 lb (127.007 kg)  BMI 34.10 kg/m2  SpO2 95% Physical Exam  Nursing note and vitals reviewed.  28 year old male, resting comfortably and in no acute distress. Vital signs are significant for hypertension with blood pressure 171/96. Oxygen saturation is 97%, which is normal. Head is normocephalic and atraumatic. PERRLA, EOMI. Oropharynx is clear. Neck is nontender and supple without adenopathy or JVD. Back is nontender and there is no CVA tenderness. Lungs are clear without rales, wheezes, or rhonchi. Chest is nontender. Heart has regular rate and rhythm without murmur. Abdomen is soft, flat, with localized tenderness at the superior aspect of the umbilicus. There is a slight soft tissue defect are consistent with a small hernia. Surgical scar is present in the left suprapubic area at site of bone harvesting and this has healed well. There are no masses or hepatosplenomegaly and peristalsis is normoactive. Extremities have no cyanosis or edema, full range of motion is present. Skin is warm  and dry without rash. Neurologic: Mental status is normal, cranial nerves are intact, there are no motor or sensory deficits.  ED Course  Procedures (including critical care time) Labs Review Results for orders placed during the hospital encounter of 10/08/13  CBC WITH DIFFERENTIAL      Result Value Ref Range   WBC 8.0  4.0 - 10.5 K/uL   RBC 5.46  4.22 - 5.81 MIL/uL   Hemoglobin 15.4  13.0 - 17.0 g/dL   HCT 16.1  09.6 - 04.5 %   MCV 83.0  78.0 - 100.0 fL   MCH 28.2  26.0 -  34.0 pg   MCHC 34.0  30.0 - 36.0 g/dL   RDW 40.9  81.1 - 91.4 %   Platelets 239  150 - 400 K/uL   Neutrophils Relative % 47  43 - 77 %   Lymphocytes Relative 42  12 - 46 %   Monocytes Relative 9  3 - 12 %   Eosinophils Relative 1  0 - 5 %   Basophils Relative 1  0 - 1 %   Neutro Abs 3.7  1.7 - 7.7 K/uL   Lymphs Abs 3.4  0.7 - 4.0 K/uL   Monocytes Absolute 0.7  0.1 - 1.0 K/uL   Eosinophils Absolute 0.1  0.0 - 0.7 K/uL   Basophils Absolute 0.1  0.0 - 0.1 K/uL  COMPREHENSIVE METABOLIC PANEL      Result Value Ref Range   Sodium 141  137 - 147 mEq/L   Potassium 4.2  3.7 - 5.3 mEq/L   Chloride 101  96 - 112 mEq/L   CO2 26  19 - 32 mEq/L   Glucose, Bld 100 (*) 70 - 99 mg/dL   BUN 10  6 - 23 mg/dL   Creatinine, Ser 7.82  0.50 - 1.35 mg/dL   Calcium 9.4  8.4 - 95.6 mg/dL   Total Protein 7.8  6.0 - 8.3 g/dL   Albumin 3.9  3.5 - 5.2 g/dL   AST 42 (*) 0 - 37 U/L   ALT 55 (*) 0 - 53 U/L   Alkaline Phosphatase 115  39 - 117 U/L   Total Bilirubin 0.3  0.3 - 1.2 mg/dL   GFR calc non Af Amer >90  >90 mL/min   GFR calc Af Amer >90  >90 mL/min  URINALYSIS, ROUTINE W REFLEX MICROSCOPIC      Result Value Ref Range   Color, Urine YELLOW  YELLOW   APPearance CLEAR  CLEAR   Specific Gravity, Urine 1.020  1.005 - 1.030   pH 6.5  5.0 - 8.0   Glucose, UA NEGATIVE  NEGATIVE mg/dL   Hgb urine dipstick NEGATIVE  NEGATIVE   Bilirubin Urine NEGATIVE  NEGATIVE   Ketones, ur NEGATIVE  NEGATIVE mg/dL   Protein, ur NEGATIVE  NEGATIVE mg/dL   Urobilinogen, UA 0.2  0.0 - 1.0 mg/dL   Nitrite NEGATIVE  NEGATIVE   Leukocytes, UA NEGATIVE  NEGATIVE   Imaging Review Ct Abdomen Pelvis W Contrast  10/08/2013   CLINICAL DATA:  Intermittent periumbilical pain since November 2014, now worsening.  EXAM: CT ABDOMEN AND PELVIS WITH CONTRAST  TECHNIQUE: Multidetector CT imaging of the abdomen and pelvis was performed using the standard protocol following bolus administration of intravenous contrast.  CONTRAST:   OMNIPAQUE IOHEXOL 300 MG/ML  SOLN  COMPARISON:  None available for comparison at time of study interpretation.  FINDINGS: Included view of the lung bases are clear. Visualized heart and  pericardium are unremarkable.  The liver is somewhat hypodense diffusely suggesting fatty infiltration, with patchy a density in the left lobe of the liver which could reflect focal fatty infiltration or transient hepatic attenuation difference. Spleen, gallbladder, pancreas and adrenal glands are unremarkable.  The stomach is under distended and unremarkable. Small and large bowel are normal in course and caliber without inflammatory changes. Mild distal small bowel feces may reflect chronic stasis. Enteric contrast has not yet reached the distal small bowel. The appendix is not discretely identified, however there are no inflammatory changes in the right lower quadrant. No intraperitoneal free fluid nor free air.  Kidneys are orthotopic, demonstrating symmetric enhancement without nephrolithiasis, hydronephrosis or renal masses. The unopacified ureters are normal in course and caliber. Urinary bladder is partially distended and unremarkable.  Aortoiliac vessels are normal in course and caliber. No lymphadenopathy by CT size criteria. Internal reproductive organs are unremarkable. Left lateral flank scarring associated with mild deformity of the iliac crest donor site which likely reflects patient's known bone harvest site.  IMPRESSION: No acute intra-abdominal/pelvic process.  Fatty liver with left hepatic lobe focal fatty spurring versus hepatic attenuation difference.   Electronically Signed   By: Awilda Metroourtnay  Bloomer   On: 10/08/2013 05:31   Images viewed by me.  MDM   Final diagnoses:  Abdominal pain    Abdominal pain which I feel is most likely from a small umbilical hernia. Because of tenderness of the pain, CT will be obtained to rule out other pathology. Old records are reviewed and he had a limited abdominal  ultrasound to evaluate a postoperative seroma at the site of the iliac crest bone harvesting but no other abdominal imaging studies.  CT is reported to be normal. I reviewed it carefully his with special attention to look if there might be a small umbilical hernia present and I believe that there is. This does seem to be the cause of his pain based on clinical exam. I have explained the findings to the patient as well as the fact that the hernia with, if present, is likely too small to be managed anyway except symptomatically. [place] [facility] [place] and is given a prescription for tramadol.  Dione Boozeavid Carlous Olivares, MD 10/08/13 [phone]

## 2013-10-08 NOTE — Discharge Instructions (Signed)
Your pain may be from a small umbilical hernia (a hernia where your belly button is). This is not dangerous. Please see the surgeon to see if they have anything to offer.  Hernia (Hernia) Una hernia ocurre cuando un rgano interno protruye a travs de un punto dbil de los msculos de la pared muscular abdominal (del vientre). Se producen con mayor frecuencia en la ingle y alrededor del ombligo. Generalmente puede volver a Museum/gallery exhibitions officer (reducirse). La mayor parte de las hernias tienden a Theme park manager con Museum/gallery conservator. Algunas hernias abdominales pueden atascarse en la abertura (hernias irreductibles o hernia encarcelada) y no pueden reducirse. Una hernia abdominal irreducible que est ligeramente apretada en la abertura, corre el riesgo de daar el flujo de sangre (hernia estrangulada). Una hernia estrangulada es una emergencia mdica. Debido al riesgo que se corre en caso de hernia irreducible o estrangulada, se recomienda la ciruga para repararla. CAUSAS  Levantar peso excesivo.  Mucha tos.  Tensin al ir de cuerpo.  Durante la ciruga abdominal se realiza un corte (incisin). INSTRUCCIONES PARA EL CUIDADO DOMICILIARIO  No es necesario hacer reposo en cama. Puede continuar con sus actividades habituales.  Evite levantar peso (ms de 10 libras o 4,5 Kg) o hacer esfuerzos.  Tos con suavidad. Si actualmente usted fuma, es el momento de abandonar el hbito. Hasta el procedimiento quirrgico ms perfecto puede malograrse si se hace fuerza contnua para toser. Aunque su hernia no haya sido reparada, la tos puede agravar el problema.  No use nada apretado sobre la hernia. No trate de mantenerla adentro con un vendaje externo o un braguero. Puede lesionar el contenido abdominal si los aprieta dentro del saco de la hernia.  Consumir una dieta normal.  Evite la constipacin. Si hace mucha fuerza aumentar el tamao de la hernia y podr daarse la reparacin. Si no lo logra slo con la dieta,  puede usar laxantes. SOLICITE ATENCIN MDICA DE INMEDIATO SI:  Tiene fiebre.  Presenta un dolor abdominal cada vez ms intenso.  Si tiene Programme researcher, broadcasting/film/video (nuseas) y vmitos.  La hernia se ha atascado fuera del abdomen, se ve descolorida, se siente dura o le duele.  Observa cambios en el hbito intestinal o en la hernia, lo que no es habitual en usted.  El dolor o la hinchazn alrededor de la hernia [place].  No puede volver a Electrical engineer hernia en su lugar ejerciendo una presin suave mientras se encuentra recostado. EST SEGURO QUE:   Comprende las instrucciones para el alta mdica.  Controlar su enfermedad.  Solicitar atencin mdica de inmediato segn las indicaciones. Document Released: 05/22/2005 Document Revised: 08/14/2011 [facility] Patient Information 2014 [place], [place].  Tramadol tablets Qu es este medicamento? El TRAMADOL es un analgsico. Se utiliza para tratar dolores moderados o severos en adultos. Este medicamento puede ser utilizado para otros usos; si tiene alguna pregunta consulte con su proveedor de atencin mdica o con su farmacutico. MARCAS COMERCIALES DISPONIBLES: Ultram Qu le debo informar a mi profesional de la salud antes de tomar este medicamento? Necesita saber si usted presenta alguno de los [employer] o situaciones: -tumor cerebral -depresin  -abuso de drogas o drogadiccin -lesin de la cabeza -si consume bebidas alcohlicas con frecuencia -enfermedad renal o problemas al orinar -enfermedad heptica -enfermedad pulmonar, asma o problemas respiratorios -convulsiones o epilepsia -ideas suicidas, planes o intento; si usted o alguien de su familia ha intentado un suicidio previo -una reaccin alrgica o inusual al tramadol, a la codena, a otros medicamentos, alimentos, colorantes  o conservantes -si est embarazada o buscando quedar embarazada -si est amamantando a un beb Cmo debo utilizar este medicamento? Tome este  medicamento por va oral con un vaso lleno de agua. Siga las instrucciones de la etiqueta del Brook[place]medicamento. Si el Social workermedicamento le produce malestar estomacal, tmelo con alimentos o con [place]. No tome su medicamento con una frecuencia mayor que la indicada. Hable con su pediatra para informarse acerca del uso de este medicamento en nios. Puede requerir atencin especial. Sobredosis: Pngase en contacto inmediatamente con un centro toxicolgico o una sala de urgencia si usted cree que haya tomado demasiado medicamento. ATENCIN: Reynolds AmericanEste medicamento es solo para usted. No comparta este medicamento con nadie. Qu sucede si me olvido de una dosis? Si olvida una dosis, tmela lo antes posible. Si es casi la hora de la prxima dosis, tome slo esa dosis. No tome dosis adicionales o dobles. Qu puede interactuar con este medicamento? No tome esta medicina con ninguno de los siguientes medicamentos: -IMAOs, tales como Carbex, Eldepryl, Marplan, Nardil y Parnate Esta medicina tambin puede interactuar con los siguientes medicamentos: -alcohol o medicamentos que contienen alcohol -antihistamnicos -benzodiacepinas -bupropion -carbamazepina u oxcarbazepina -clozapina -ciclobenzaprina -digoxina -furazolidona -linezolid -medicamentos para la depresin, ansiedad o trastornos psicticos -medicamentos para las migraas, tales como almotriptn, eletriptn, frovatriptn, naratriptn, rizatriptn, sumatriptn, zolmitriptn -analgsicos, incluyendo pentazocina, buprenorfina, butorfanol, meperidina, nalbufina y propoxifeno -medicamentos para conciliar el sueo -relajantes musculares -naltrexona -fenobarbital -fenotiazinas, tales como perfenacina, tioridazina, clorpromacina, mesoridazina, flufenazina, proclorperazina, promazina y trifluoperazina -procarbazina -warfarina Puede ser que esta lista no menciona todas las posibles interacciones. Informe a su profesional de Beazer Homesla salud de [employer] Inctodos los productos a base de  hierbas, medicamentos de [place] libre o suplementos nutritivos que est tomando. Si usted fuma, consume bebidas alcohlicas o si utiliza drogas ilegales, indqueselo tambin a su profesional de Beazer Homesla salud. Algunas sustancias pueden interactuar con su medicamento. A qu debo estar atento al usar PPL Corporationeste medicamento? Si el dolor no desaparece, si empeora o si experimenta un dolor nuevo o de tipo diferente, consulte a su mdico o a su profesional de Beazer Homesla salud. Usted puede desarrollar tolerancia al medicamento. La tolerancia significa que necesitar una dosis ms alta para Engineer, materialsaliviar el dolor. Tolerancia es normal y esperada cuando est tomando este medicamento por un largo perodo de [place]. No suspenda el uso de su medicamento repentinamente debido a que puede Copywriter, advertisingdesarrollar una reaccin severa. Su cuerpo se acostumbra a Industrial/product designereste medicamento. Esto NO significa que sea adicto. La adiccin es un comportamiento que hace referencia a la obtencin y utilizacin de un medicamento con fines que no son mdicos. Si tiene Engineer, miningdolor, existe una razn mdica para que usted tome un analgsico. Su mdico le indicar la cantidad de medicamento que Mudloggernecesitar tomar. Si su mdico desea que Colgatepare el tratamiento, la dosis ser reducida gradualmente para Psychiatric nurseevitar efectos secundarios. Puede experimentar mareos o somnolencia. No conduzca ni utilice maquinaria, ni haga nada que Scientist, research (life sciences)le exija permanecer en estado de alerta hasta que sepa cmo le afecta este medicamento. No se siente ni se ponga de pie con rapidez, especialmente si es un paciente de edad avanzada. Esto reduce el riesgo de mareos o Newell Rubbermaiddesmayos. El alcohol puede aumentar o disminuir el efecto de [place] medicamento. Evite consumir bebidas alcohlicas. Este medicamento puede causar estreimiento. Trate de evacuar los intestinos al menos cada 2  3 das. Si no evacua los intestinos durante [address], comunquese con su mdico o con su profesional de Beazer Homesla salud. Se le podr secar la boca. Masticar chicle sin  azcar, chupar caramelos duros y tomar agua en abundancia le ayudar a mantener la boca hmeda. Si el problema no desaparece o es severo, consulte a su mdico. Qu efectos secundarios puedo tener al Boston Scientificutilizar este medicamento? Efectos secundarios que debe informar a su mdico o a Producer, television/film/videosu profesional de la salud tan pronto como sea posible: -Therapist, artreacciones alrgicas como erupcin cutnea, picazn o urticarias, hinchazn de la cara, labios o lengua -dificultades respiratorias, sibilancias -confusin -picazn -aturdimiento o desmayos -enrojecimiento, formacin de ampollas, descamacin o aflojamiento de la piel, inclusive dentro de la boca -convulsiones Efectos secundarios que, por lo general, no requieren atencin mdica (debe informarlos a su mdico o a su profesional de la salud si persisten o si son molestos): -estreimiento -mareos -somnolencia -dolor de cabeza -nuseas, vmitos Puede ser que esta lista no menciona todos los posibles efectos secundarios. Comunquese a su mdico por asesoramiento mdico Hewlett-Packardsobre los efectos secundarios. Usted puede informar los efectos secundarios a la FDA por telfono al 1-800-FDA-1088. Dnde debo guardar mi medicina? Mantngala fuera del alcance de los nios. Gurdela a Sanmina-SCItemperatura ambiente, entre 15 y 30 grados C ([phone] y [phone] grados F). Mantenga el envase bien cerrado. Deseche los medicamentos que no haya utilizado, despus de la fecha de vencimiento. ATENCIN: Este folleto es un resumen. Puede ser que no cubra toda la posible informacin. Si usted tiene preguntas acerca de esta medicina, consulte con su mdico, su farmacutico o su profesional de Radiographer, therapeuticla salud.  2014, Elsevier/Gold Standard. (2010-03-02 17:11:17)

## 2014-10-03 IMAGING — RF DG C-ARM 1-60 MIN-NO REPORT
1 series · 2 of 2 positions shown · non-contrast
Comparison: None.

CLINICAL DATA: Osteomyelitis of the left hand.

DG C-ARM 1-60 MIN - NRPT [facility],LEFT HAND - 2 VIEW

[Series 1: run · 2 of 2 slices shown]
[im 1/2]
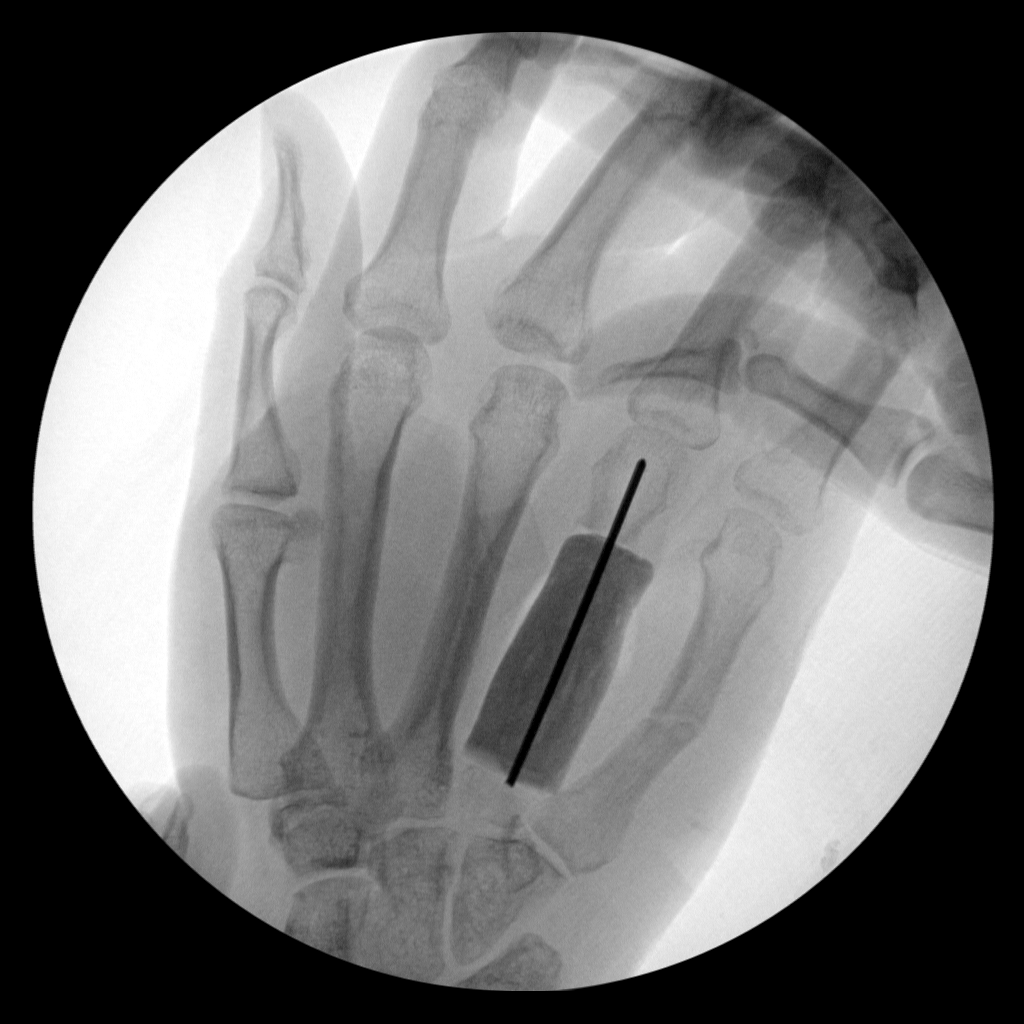
[im 2/2]
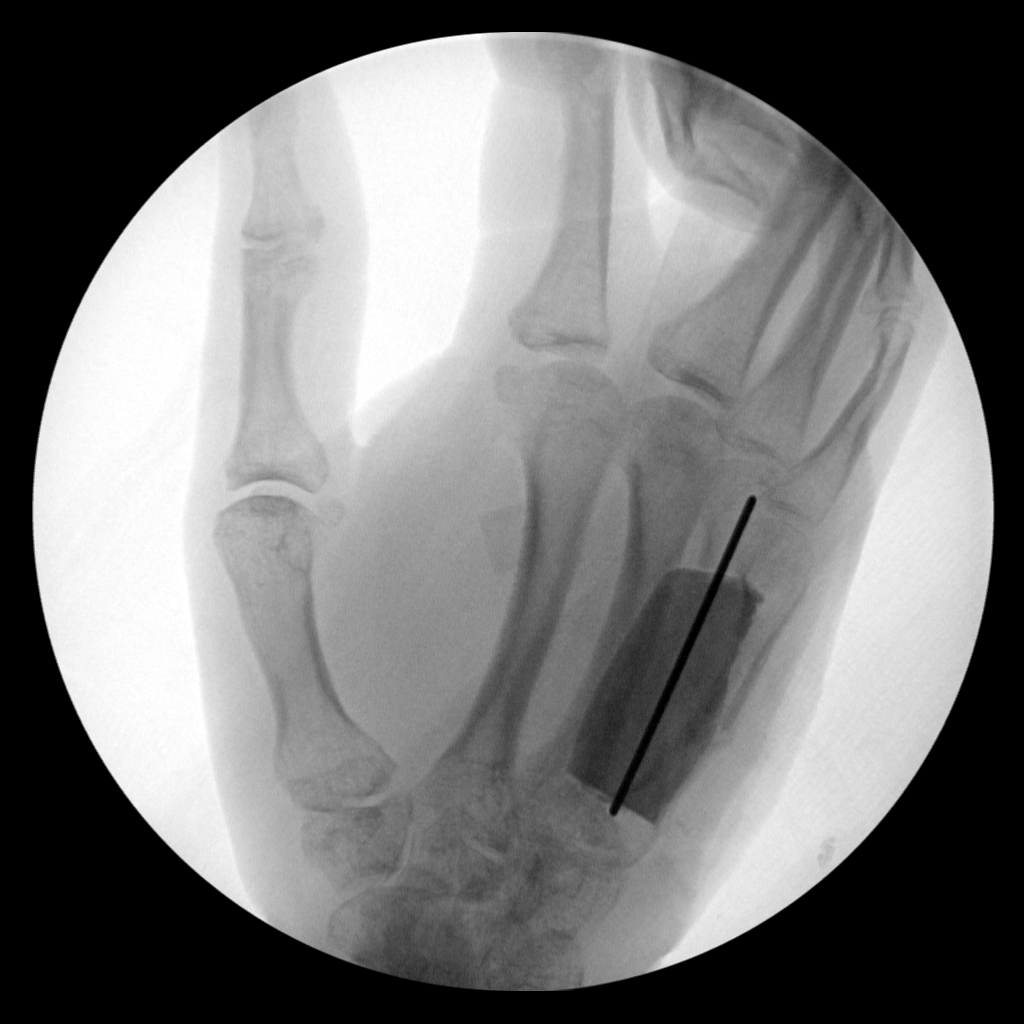

[2 of 2 positions shown; findings below may reference images not displayed]

FINDINGS: Fluoroscopic spot films demonstrate placement of bone
graft in the left hand with K-wire fixation.  Fifth metacarpal
fracture is again noted.  Two intraoperative fluoroscopic spot
views are submitted for interpretation.
IMPRESSION: Fourth metacarpal debridement with bone graft placement.

## 2015-12-16 IMAGING — CT CT ABD-PELV W/ CM
2 of 4 series · 16 of 46 positions shown, 18 images · IV contrast (Omni 300)
Comparison: None available for comparison at time of study
interpretation.

CLINICAL DATA: Intermittent periumbilical pain since April 2013,
now worsening.

EXAM:
CT ABDOMEN AND PELVIS WITH CONTRAST
TECHNIQUE: Multidetector CT imaging of the abdomen and pelvis was performed
using the standard protocol following bolus administration of
intravenous contrast.
CONTRAST:  100mL OMNIPAQUE IOHEXOL 300 MG/ML  SOLN

[Series 3: abd/ pelvis 5.0 i30f 1 · axial · 0.86mm/px · z∈[-494,-49]mm · 13 of 99 slices shown, 15 images]
[im 5/99  soft-tissue]
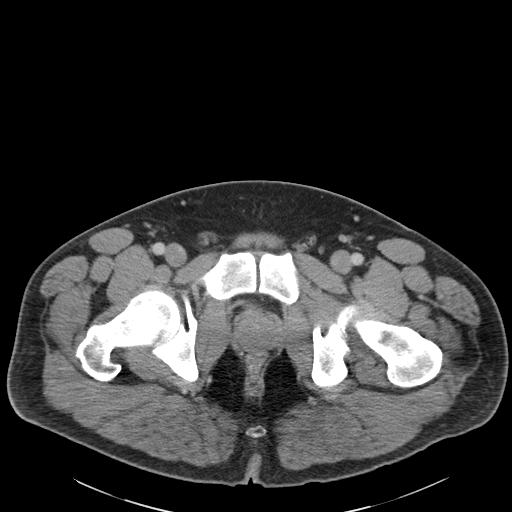
[im 5/99  bone]
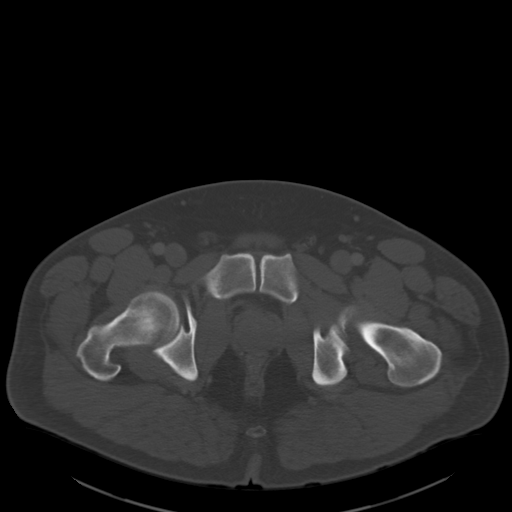
[im 13/99  soft-tissue]
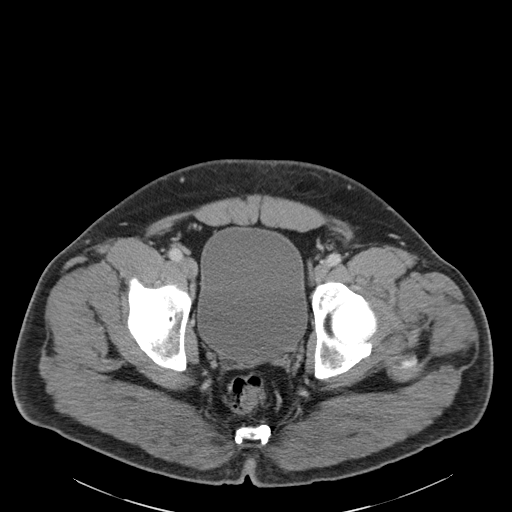
[im 22/99  soft-tissue]
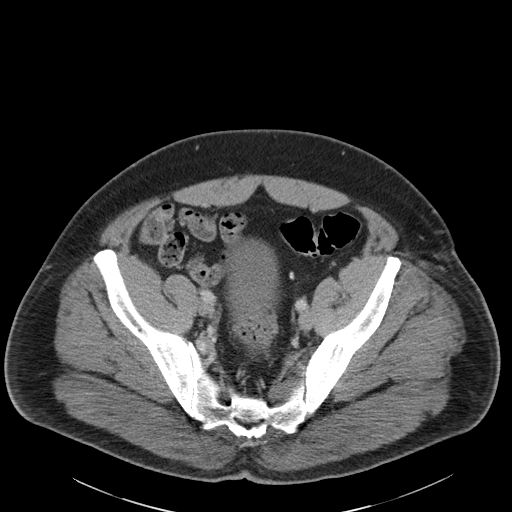
[im 26/99  soft-tissue]
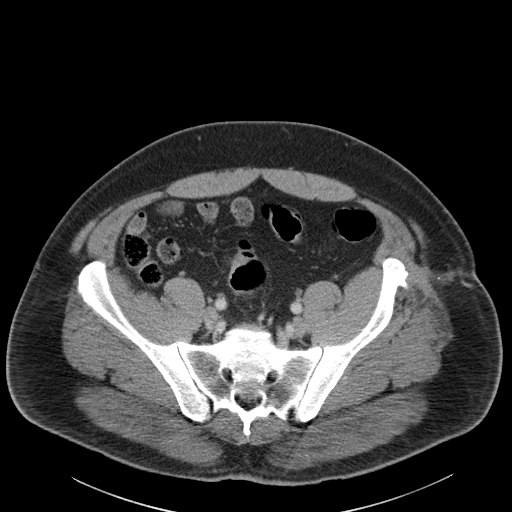
[im 35/99  soft-tissue]
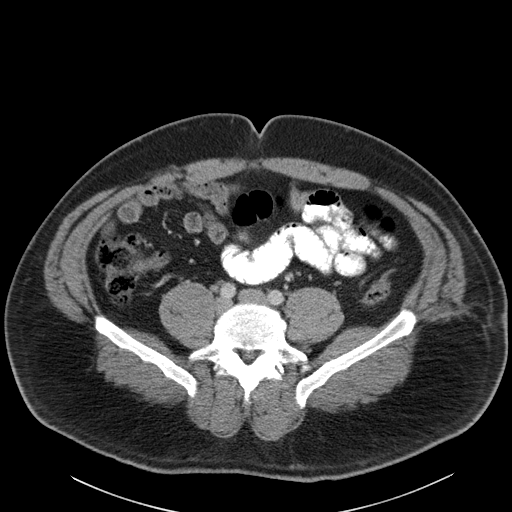
[im 43/99  soft-tissue]
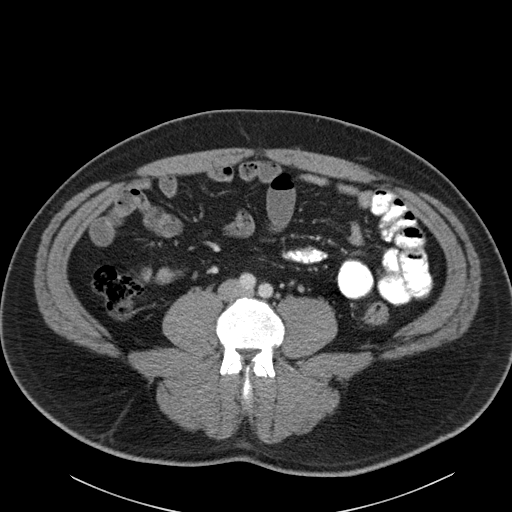
[im 52/99  soft-tissue]
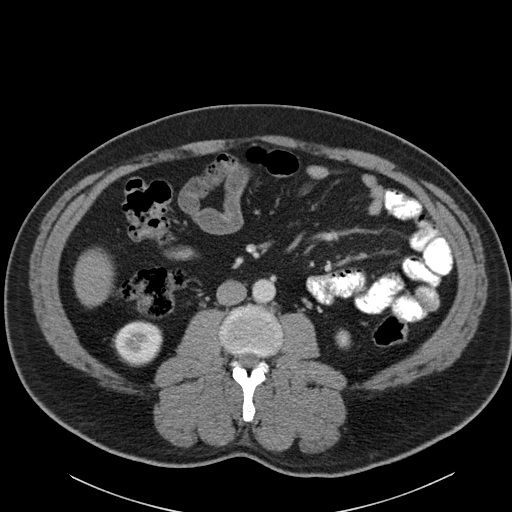
[im 56/99  soft-tissue]
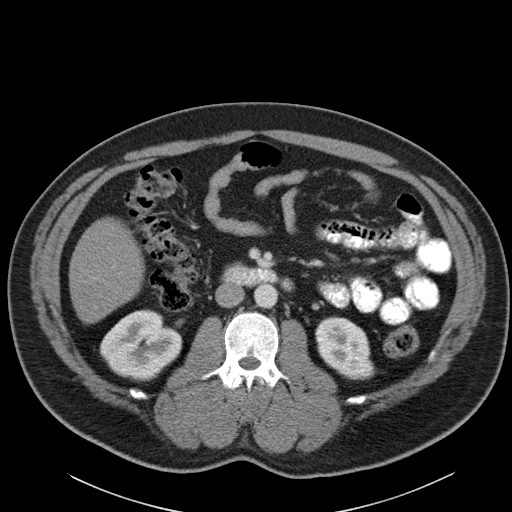
[im 64/99  soft-tissue]
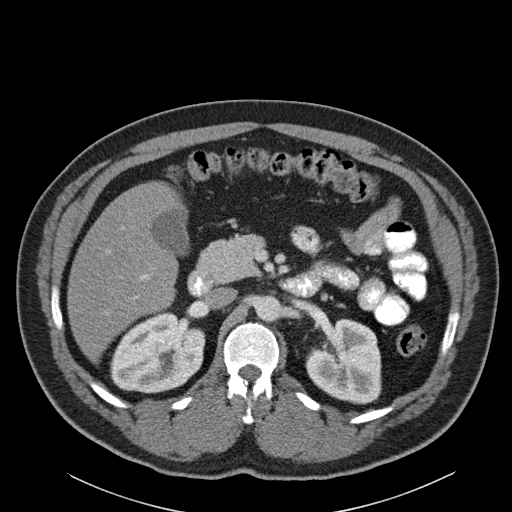
[im 64/99  bone]
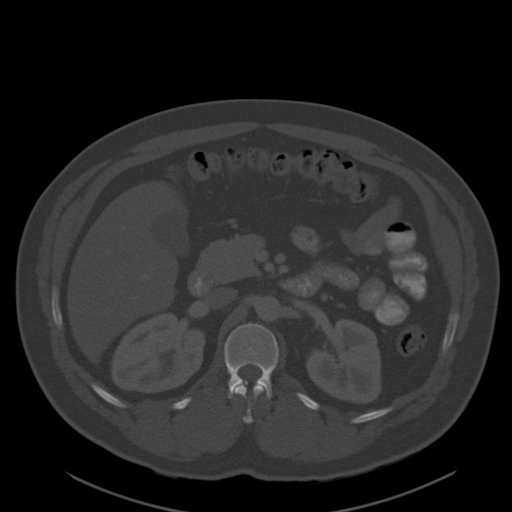
[im 73/99  soft-tissue]
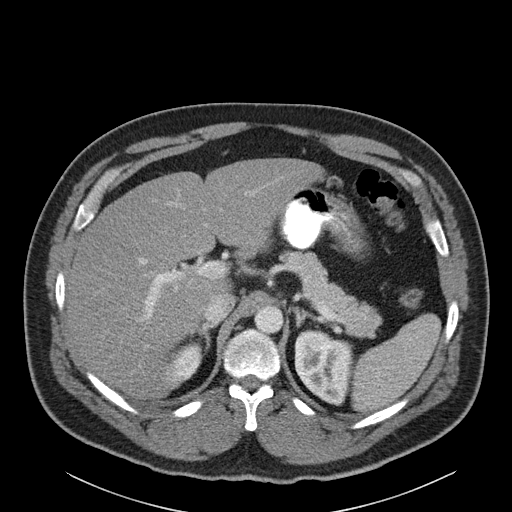
[im 77/99  soft-tissue]
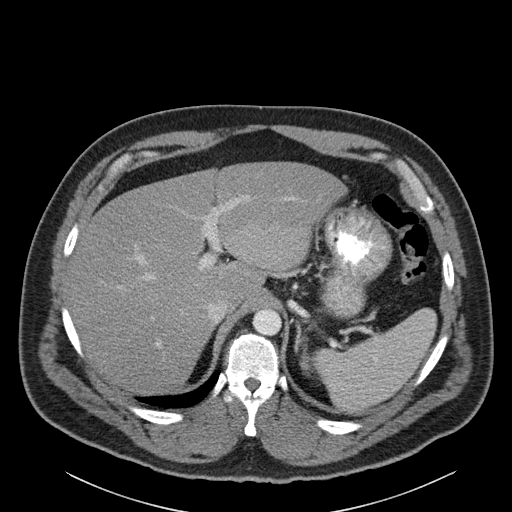
[im 86/99  soft-tissue]
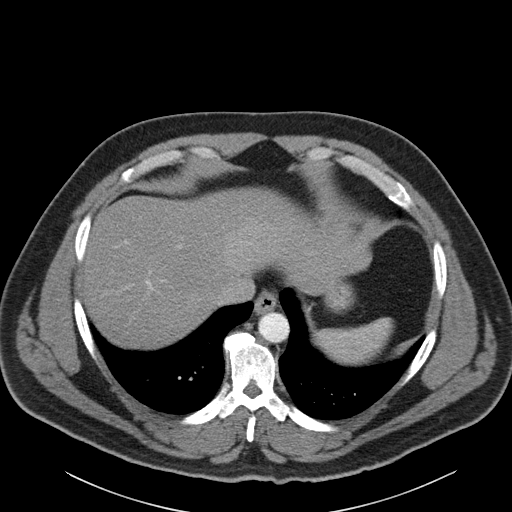
[im 94/99  soft-tissue]
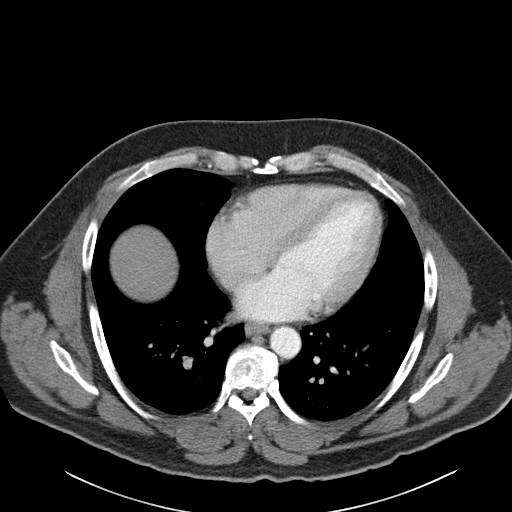

[Series 6: coronals · coronal · 0.76mm/px · 3 of 145 slices shown]
[im 49/145  soft-tissue]
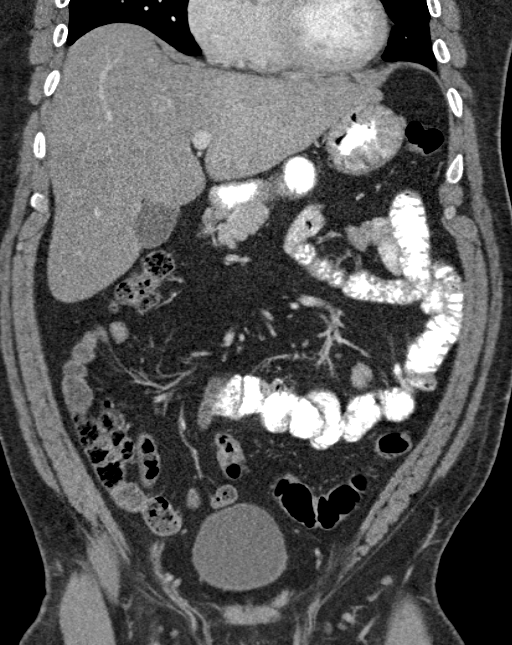
[im 65/145  soft-tissue]
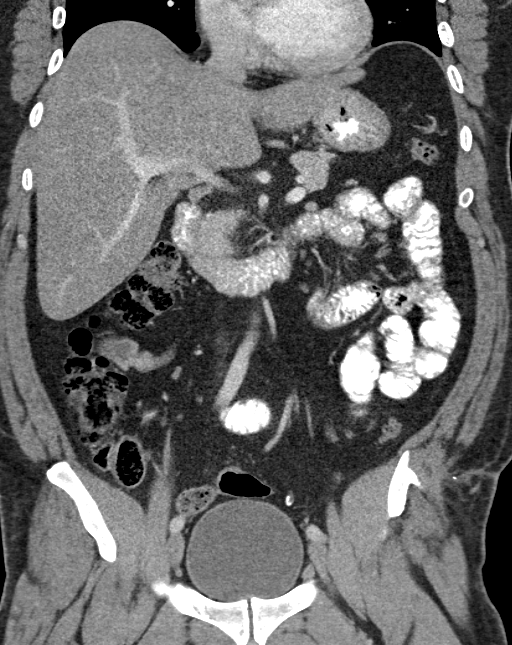
[im 81/145  soft-tissue]
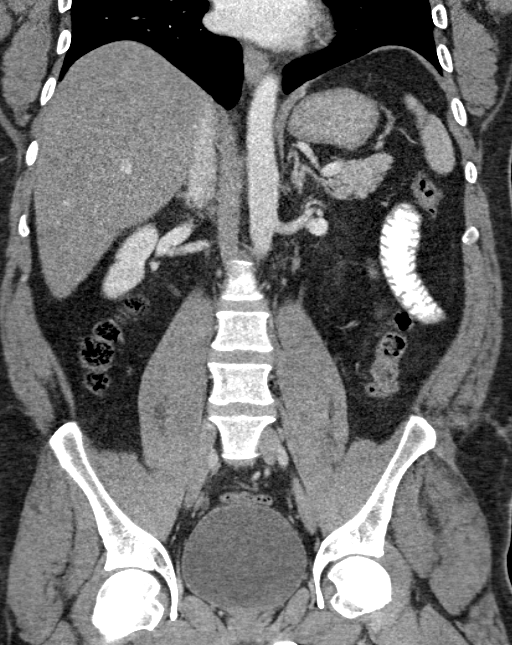

[16 of 46 positions shown; findings below may reference images not displayed]

FINDINGS: Included view of the lung bases are clear. Visualized heart and
pericardium are unremarkable.

The liver is somewhat hypodense diffusely suggesting fatty
infiltration, with patchy a density in the left lobe of the liver
which could reflect focal fatty infiltration or transient hepatic
attenuation difference. Spleen, gallbladder, pancreas and adrenal
glands are unremarkable.

The stomach is under distended and unremarkable. Small and large
bowel are normal in course and caliber without inflammatory changes.
Mild distal small bowel feces may reflect chronic stasis. Enteric
contrast has not yet reached the distal small bowel. The appendix is
not discretely identified, however there are no inflammatory changes
in the right lower quadrant. No intraperitoneal free fluid nor free
air.

Kidneys are orthotopic, demonstrating symmetric enhancement without
nephrolithiasis, hydronephrosis or renal masses. The unopacified
ureters are normal in course and caliber. Urinary bladder is
partially distended and unremarkable.

Aortoiliac vessels are normal in course and caliber. No
lymphadenopathy by CT size criteria. Internal reproductive organs
are unremarkable. Left lateral flank scarring associated with mild
deformity of the iliac crest donor site which likely reflects
patient's known bone harvest site.
IMPRESSION: No acute intra-abdominal/pelvic process.

Fatty liver with left hepatic lobe focal fatty spurring versus
hepatic attenuation difference.

  By: Dontay Vance
